# Patient Record
Sex: Male | Born: 1950 | Race: White | Hispanic: No | Marital: Single | Smoking: Never smoker
Health system: Southern US, Community
[De-identification: ages and names within clinical notes are randomized; demographics above are authoritative.]

## PROBLEM LIST (undated history)

## (undated) DIAGNOSIS — I2510 Atherosclerotic heart disease of native coronary artery without angina pectoris: Secondary | ICD-10-CM

## (undated) DIAGNOSIS — I219 Acute myocardial infarction, unspecified: Secondary | ICD-10-CM

## (undated) DIAGNOSIS — I10 Essential (primary) hypertension: Secondary | ICD-10-CM

## (undated) DIAGNOSIS — E785 Hyperlipidemia, unspecified: Secondary | ICD-10-CM

## (undated) HISTORY — PX: PELVIC FRACTURE SURGERY: SHX119

## (undated) HISTORY — DX: Hyperlipidemia, unspecified: E78.5

---

## 2003-07-07 ENCOUNTER — Emergency Department (HOSPITAL_COMMUNITY): Admission: EM | Admit: 2003-07-07 | Discharge: 2003-07-07 | Payer: Self-pay | Admitting: Emergency Medicine
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
## 2007-01-12 ENCOUNTER — Emergency Department (HOSPITAL_COMMUNITY): Admission: EM | Admit: 2007-01-12 | Discharge: 2007-01-12 | Payer: Self-pay | Admitting: Emergency Medicine

## 2008-03-25 IMAGING — CR DG PELVIS 1-2V
3 series · 3 of 3 positions shown · non-contrast
Comparison: none

CLINICAL DATA: Groin and coccyx pain after falling off a horse yesterday.

PELVIS - 3 VIEW:

[view not recorded (1 of 3)]
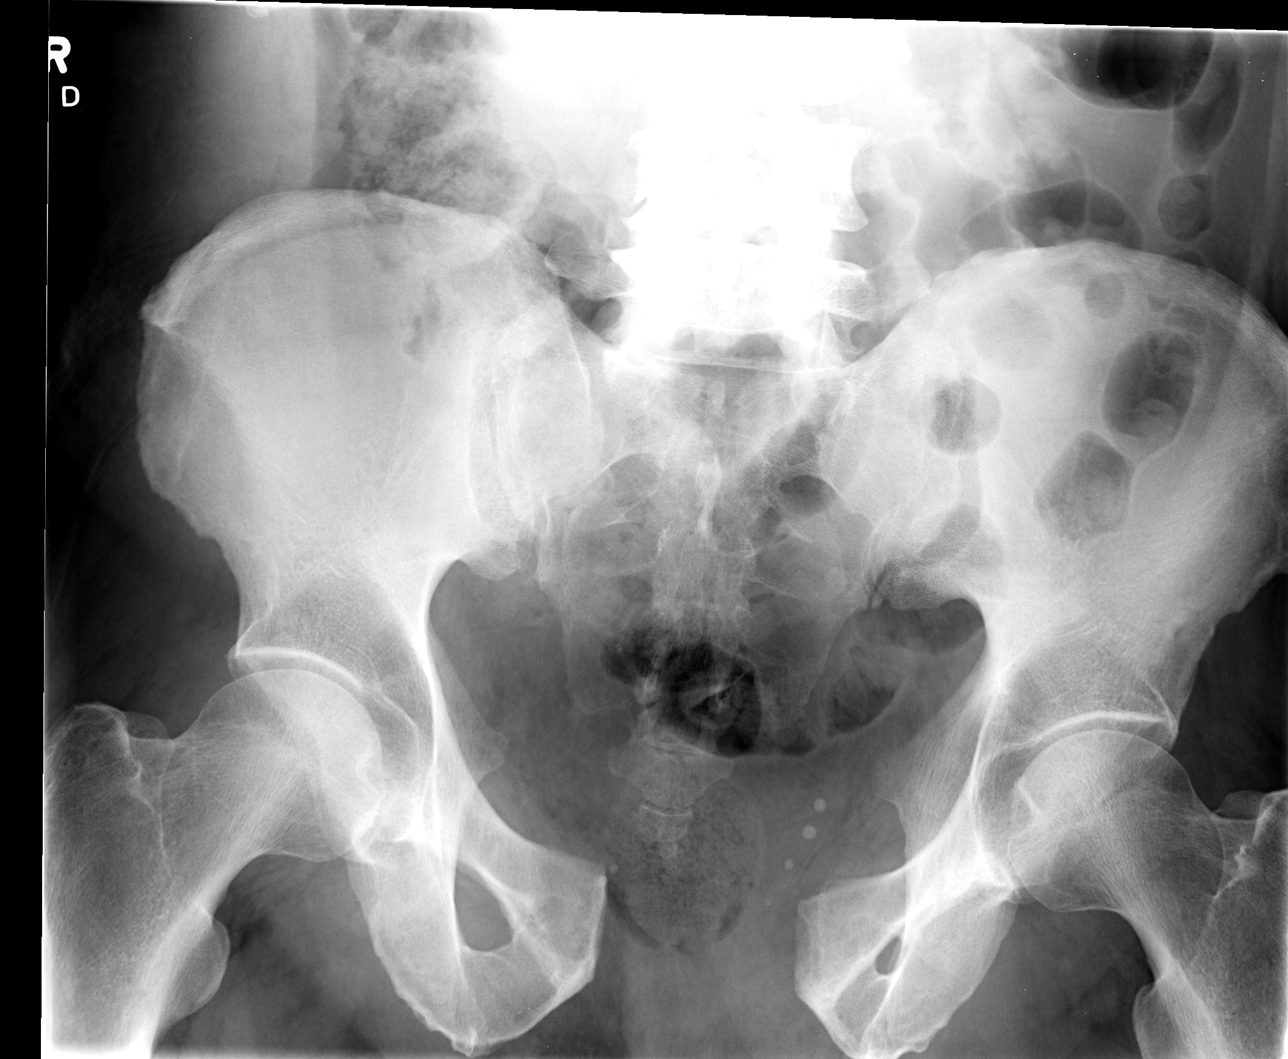

[view not recorded (2 of 3)]
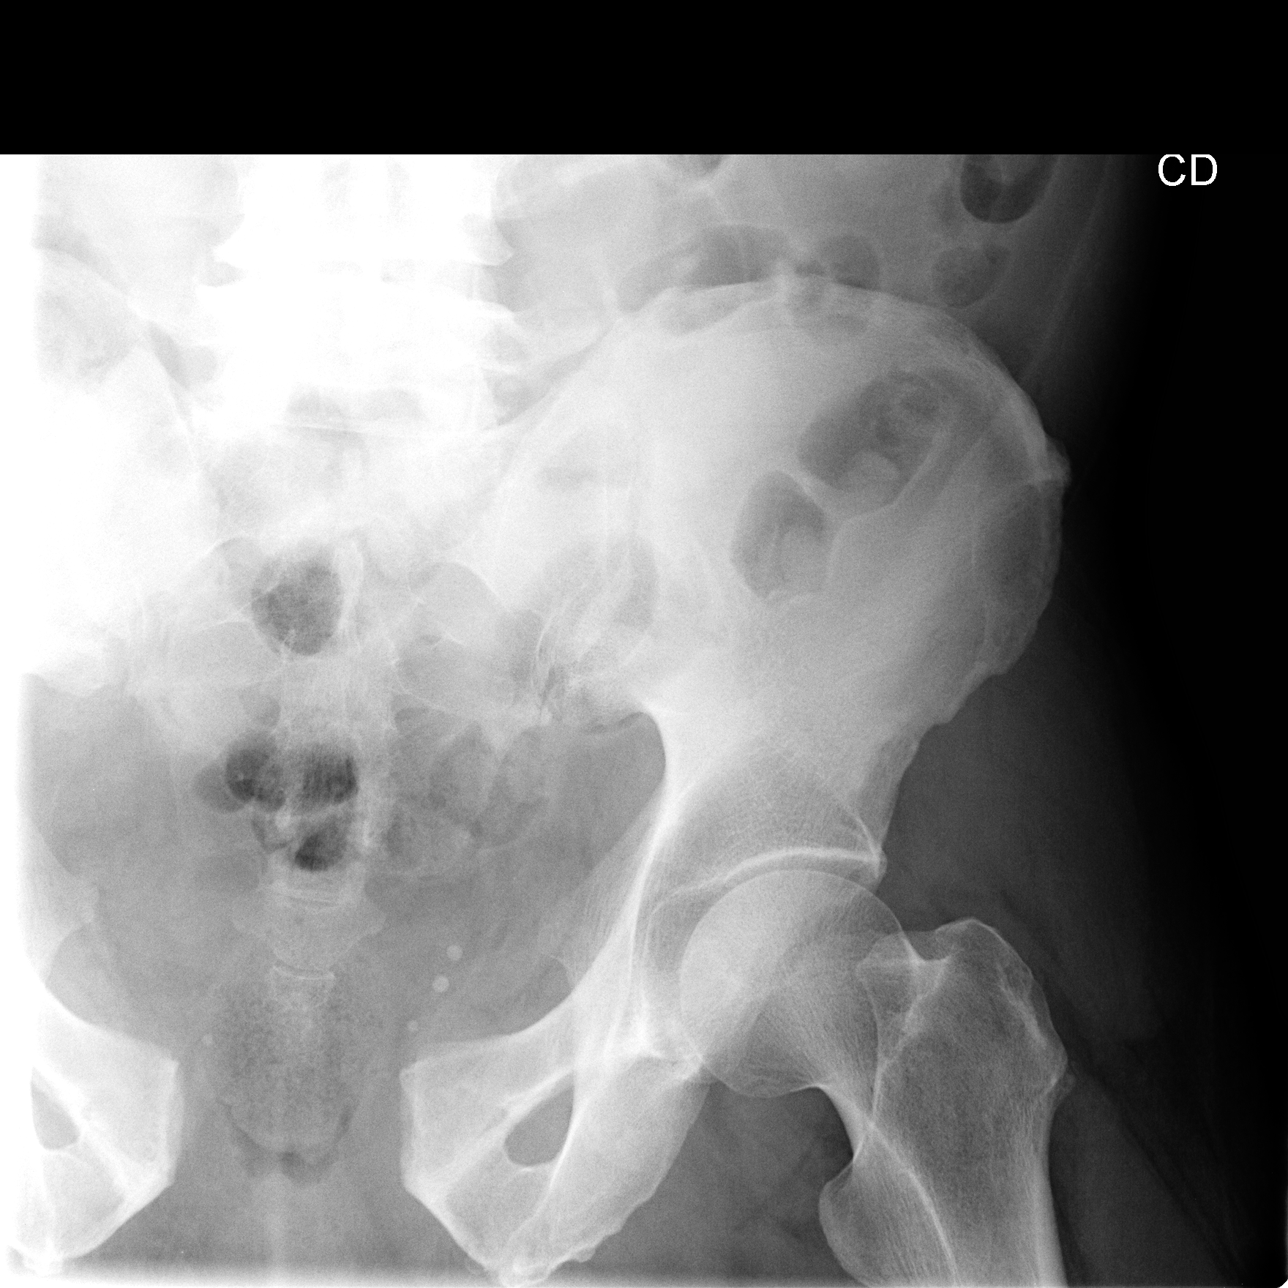

[view not recorded (3 of 3)]
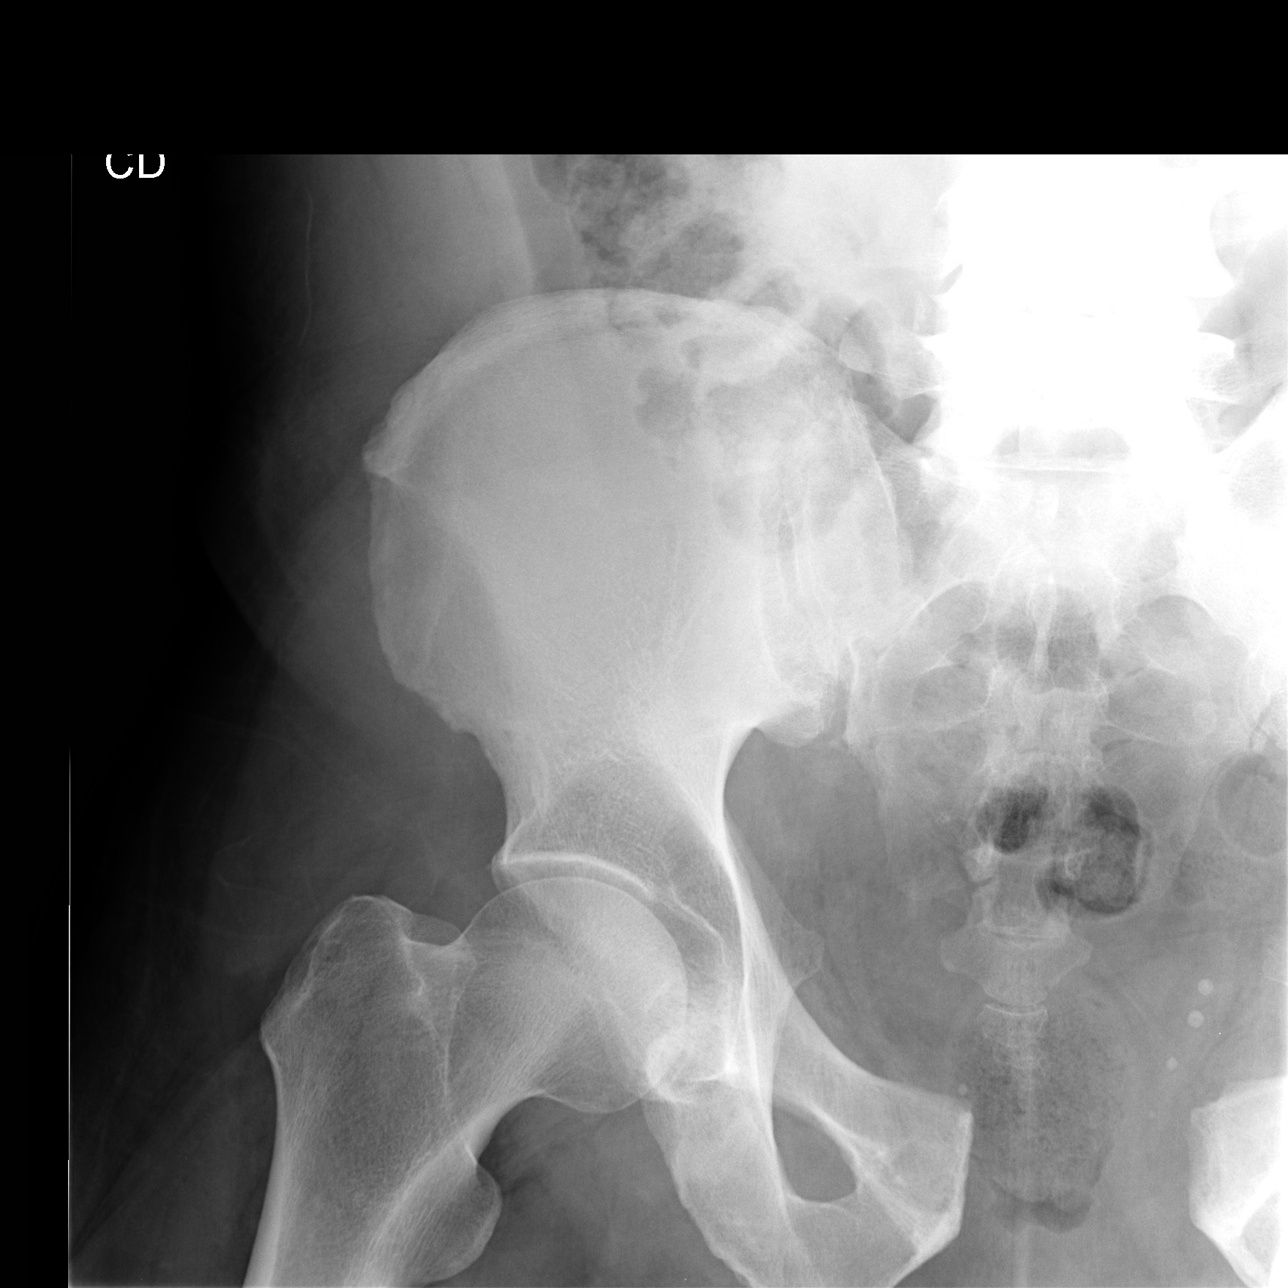

[3 of 3 positions shown; findings below may reference images not displayed]

FINDINGS: Marked diastasis of the symphysis pubis, measuring 6.6 cm and width.
Mild widening of the right sacroiliac joint. No fractures seen. Lower lumbar
spine degenerative changes and mild scoliosis.
IMPRESSION: 1. Marked symphysis pubis diastasis with mild widening of the right sacroiliac
joint.
2. No fracture seen.
3. Lower lumbar spine degenerative changes and mild scoliosis.

## 2019-01-02 DIAGNOSIS — I480 Paroxysmal atrial fibrillation: Secondary | ICD-10-CM

## 2019-01-02 HISTORY — DX: Paroxysmal atrial fibrillation: I48.0

## 2019-01-23 ENCOUNTER — Inpatient Hospital Stay (HOSPITAL_BASED_OUTPATIENT_CLINIC_OR_DEPARTMENT_OTHER)
Admission: EM | Admit: 2019-01-23 | Discharge: 2019-01-28 | DRG: 282 | Disposition: A | Discharge status: Home / Self Care | Payer: Commercial Indemnity | Attending: Cardiology | Admitting: Cardiology

## 2019-01-23 ENCOUNTER — Encounter (HOSPITAL_BASED_OUTPATIENT_CLINIC_OR_DEPARTMENT_OTHER): Payer: Self-pay

## 2019-01-23 ENCOUNTER — Other Ambulatory Visit: Payer: Self-pay

## 2019-01-23 ENCOUNTER — Emergency Department (HOSPITAL_BASED_OUTPATIENT_CLINIC_OR_DEPARTMENT_OTHER): Payer: Commercial Indemnity

## 2019-01-23 DIAGNOSIS — I4891 Unspecified atrial fibrillation: Secondary | ICD-10-CM | POA: Diagnosis not present

## 2019-01-23 DIAGNOSIS — Z20828 Contact with and (suspected) exposure to other viral communicable diseases: Secondary | ICD-10-CM | POA: Diagnosis present

## 2019-01-23 DIAGNOSIS — I214 Non-ST elevation (NSTEMI) myocardial infarction: Secondary | ICD-10-CM | POA: Diagnosis present

## 2019-01-23 DIAGNOSIS — E785 Hyperlipidemia, unspecified: Secondary | ICD-10-CM | POA: Diagnosis present

## 2019-01-23 DIAGNOSIS — Z803 Family history of malignant neoplasm of breast: Secondary | ICD-10-CM | POA: Diagnosis not present

## 2019-01-23 DIAGNOSIS — I2582 Chronic total occlusion of coronary artery: Secondary | ICD-10-CM | POA: Diagnosis present

## 2019-01-23 DIAGNOSIS — Z9119 Patient's noncompliance with other medical treatment and regimen: Secondary | ICD-10-CM

## 2019-01-23 DIAGNOSIS — R079 Chest pain, unspecified: Secondary | ICD-10-CM

## 2019-01-23 DIAGNOSIS — E782 Mixed hyperlipidemia: Secondary | ICD-10-CM

## 2019-01-23 DIAGNOSIS — Z79899 Other long term (current) drug therapy: Secondary | ICD-10-CM | POA: Diagnosis not present

## 2019-01-23 DIAGNOSIS — Z8249 Family history of ischemic heart disease and other diseases of the circulatory system: Secondary | ICD-10-CM

## 2019-01-23 DIAGNOSIS — I10 Essential (primary) hypertension: Secondary | ICD-10-CM | POA: Diagnosis present

## 2019-01-23 DIAGNOSIS — I480 Paroxysmal atrial fibrillation: Secondary | ICD-10-CM

## 2019-01-23 DIAGNOSIS — I200 Unstable angina: Secondary | ICD-10-CM | POA: Diagnosis not present

## 2019-01-23 DIAGNOSIS — I252 Old myocardial infarction: Secondary | ICD-10-CM | POA: Diagnosis not present

## 2019-01-23 DIAGNOSIS — I2510 Atherosclerotic heart disease of native coronary artery without angina pectoris: Secondary | ICD-10-CM | POA: Diagnosis present

## 2019-01-23 HISTORY — DX: Atherosclerotic heart disease of native coronary artery without angina pectoris: I25.10

## 2019-01-23 HISTORY — DX: Acute myocardial infarction, unspecified: I21.9

## 2019-01-23 HISTORY — DX: Essential (primary) hypertension: I10

## 2019-01-23 LAB — BASIC METABOLIC PANEL
Anion gap: 11 (ref 5–15)
BUN: 15 mg/dL (ref 8–23)
CO2: 23 mmol/L (ref 22–32)
Calcium: 9.3 mg/dL (ref 8.9–10.3)
Chloride: 98 mmol/L (ref 98–111)
Creatinine, Ser: 1.01 mg/dL (ref 0.61–1.24)
GFR calc Af Amer: >60 mL/min (ref >60)
GFR calc non Af Amer: >60 mL/min (ref >60)
Glucose, Bld: 129 mg/dL — ABNORMAL HIGH (ref 70–99)
Potassium: 3.8 mmol/L (ref 3.5–5.1)
Sodium: 132 mmol/L — ABNORMAL LOW (ref 135–145)

## 2019-01-23 LAB — TROPONIN I
Troponin I: 7.2 ng/mL (ref <0.03)
Troponin I: 8.1 ng/mL (ref <0.03)

## 2019-01-23 LAB — HEPATIC FUNCTION PANEL
ALT: 35 U/L (ref 0–44)
AST: 80 U/L — ABNORMAL HIGH (ref 15–41)
Albumin: 4.6 g/dL (ref 3.5–5.0)
Alkaline Phosphatase: 84 U/L (ref 38–126)
Bilirubin, Direct: 0.1 mg/dL (ref 0.0–0.2)
Indirect Bilirubin: 1.1 mg/dL — ABNORMAL HIGH (ref 0.3–0.9)
Total Bilirubin: 1.2 mg/dL (ref 0.3–1.2)
Total Protein: 7.9 g/dL (ref 6.5–8.1)

## 2019-01-23 LAB — HEPARIN LEVEL (UNFRACTIONATED): Heparin Unfractionated: 0.13 IU/mL — ABNORMAL LOW (ref 0.30–0.70)

## 2019-01-23 LAB — CBC
HCT: 47.6 % (ref 39.0–52.0)
Hemoglobin: 16.2 g/dL (ref 13.0–17.0)
MCH: 29.5 pg (ref 26.0–34.0)
MCHC: 34 g/dL (ref 30.0–36.0)
MCV: 86.5 fL (ref 80.0–100.0)
Platelets: 180 10*3/uL (ref 150–400)
RBC: 5.5 MIL/uL (ref 4.22–5.81)
RDW: 13.1 % (ref 11.5–15.5)
WBC: 14.8 10*3/uL — ABNORMAL HIGH (ref 4.0–10.5)
nRBC: 0 % (ref 0.0–0.2)

## 2019-01-23 LAB — BRAIN NATRIURETIC PEPTIDE: B Natriuretic Peptide: 226.3 pg/mL — ABNORMAL HIGH (ref 0.0–100.0)

## 2019-01-23 LAB — LIPASE, BLOOD: Lipase: 26 U/L (ref 11–51)

## 2019-01-23 LAB — PROTIME-INR
INR: 1 (ref 0.8–1.2)
Prothrombin Time: 13.5 seconds (ref 11.4–15.2)

## 2019-01-23 LAB — SARS CORONAVIRUS 2 AG (30 MIN TAT): SARS Coronavirus 2 Ag: NEGATIVE

## 2019-01-23 LAB — MAGNESIUM: Magnesium: 1.8 mg/dL (ref 1.7–2.4)

## 2019-01-23 LAB — APTT: aPTT: 26 seconds (ref 24–36)

## 2019-01-23 MED ORDER — METOPROLOL TARTRATE 25 MG PO TABS
25.0000 mg | ORAL_TABLET | Freq: Two times a day (BID) | ORAL | Status: DC
  Administered 2019-01-23 – 2019-01-26 (×6): 25 mg via ORAL
  Filled 2019-01-23 (×6): qty 1

## 2019-01-23 MED ORDER — HEPARIN (PORCINE) 25000 UT/250ML-% IV SOLN
INTRAVENOUS | Status: AC
  Filled 2019-01-23: qty 250

## 2019-01-23 MED ORDER — HEPARIN (PORCINE) 25000 UT/250ML-% IV SOLN
2150.0000 [IU]/h | INTRAVENOUS | Status: DC
  Administered 2019-01-23: 1350 [IU]/h via INTRAVENOUS
  Administered 2019-01-24: 1800 [IU]/h via INTRAVENOUS
  Administered 2019-01-24 – 2019-01-25 (×2): 1900 [IU]/h via INTRAVENOUS
  Administered 2019-01-25 – 2019-01-27 (×5): 2150 [IU]/h via INTRAVENOUS
  Filled 2019-01-23 (×8): qty 250

## 2019-01-23 MED ORDER — DILTIAZEM HCL 100 MG IV SOLR
INTRAVENOUS | Status: AC
  Filled 2019-01-23: qty 100

## 2019-01-23 MED ORDER — ASPIRIN EC 81 MG PO TBEC
81.0000 mg | DELAYED_RELEASE_TABLET | Freq: Every day | ORAL | Status: DC
  Administered 2019-01-24 – 2019-01-27 (×4): 81 mg via ORAL
  Filled 2019-01-23 (×3): qty 1

## 2019-01-23 MED ORDER — NITROGLYCERIN 0.4 MG SL SUBL
0.4000 mg | SUBLINGUAL_TABLET | SUBLINGUAL | Status: DC | PRN
  Filled 2019-01-23: qty 1

## 2019-01-23 MED ORDER — LISINOPRIL 5 MG PO TABS
5.0000 mg | ORAL_TABLET | Freq: Every day | ORAL | Status: DC
  Administered 2019-01-23 – 2019-01-28 (×6): 5 mg via ORAL
  Filled 2019-01-23 (×6): qty 1

## 2019-01-23 MED ORDER — SODIUM CHLORIDE 0.9% FLUSH
3.0000 mL | Freq: Once | INTRAVENOUS | Status: DC
  Filled 2019-01-23: qty 3

## 2019-01-23 MED ORDER — ACETAMINOPHEN 325 MG PO TABS
650.0000 mg | ORAL_TABLET | ORAL | Status: DC | PRN
  Administered 2019-01-23 – 2019-01-25 (×4): 650 mg via ORAL
  Filled 2019-01-23 (×4): qty 2

## 2019-01-23 MED ORDER — ATORVASTATIN CALCIUM 80 MG PO TABS
80.0000 mg | ORAL_TABLET | Freq: Every day | ORAL | Status: DC
  Administered 2019-01-24 – 2019-01-27 (×4): 80 mg via ORAL
  Filled 2019-01-23 (×4): qty 1

## 2019-01-23 MED ORDER — ONDANSETRON HCL 4 MG/2ML IJ SOLN: 4.0000 mg | Freq: Four times a day (QID) | INTRAMUSCULAR | Status: DC | PRN

## 2019-01-23 MED ORDER — HEPARIN BOLUS VIA INFUSION
3000.0000 [IU] | Freq: Once | INTRAVENOUS | Status: AC
  Administered 2019-01-24: 3000 [IU] via INTRAVENOUS
  Filled 2019-01-23: qty 3000

## 2019-01-23 MED ORDER — ASPIRIN 81 MG PO CHEW
324.0000 mg | CHEWABLE_TABLET | Freq: Once | ORAL | Status: AC
  Administered 2019-01-23: 16:00:00 324 mg via ORAL
  Filled 2019-01-23: qty 4

## 2019-01-23 MED ORDER — DILTIAZEM HCL 100 MG IV SOLR
5.0000 mg/h | INTRAVENOUS | Status: DC
  Administered 2019-01-23: 5 mg/h via INTRAVENOUS

## 2019-01-23 MED ORDER — HEPARIN BOLUS VIA INFUSION
4000.0000 [IU] | Freq: Once | INTRAVENOUS | Status: AC
  Administered 2019-01-23: 4000 [IU] via INTRAVENOUS

## 2019-01-23 MED ORDER — NITROGLYCERIN IN D5W 200-5 MCG/ML-% IV SOLN
0.0000 ug/min | INTRAVENOUS | Status: DC
  Administered 2019-01-23: 5 ug/min via INTRAVENOUS
  Filled 2019-01-23: qty 250

## 2019-01-23 MED ORDER — DILTIAZEM HCL 25 MG/5ML IV SOLN
INTRAVENOUS | Status: AC
Start: 2019-01-23 — End: 2019-01-24
  Filled 2019-01-23: qty 5

## 2019-01-23 MED ORDER — SODIUM CHLORIDE 0.9 % IV BOLUS
1000.0000 mL | Freq: Once | INTRAVENOUS | Status: AC
  Administered 2019-01-23: 1000 mL via INTRAVENOUS

## 2019-01-23 NOTE — ED Provider Notes (Signed)
[facility] EMERGENCY DEPARTMENT Provider Note   CSN: [id number] Arrival date & time: 01/23/19  1525    History   Chief Complaint Chief Complaint  Patient presents with  . Chest Pain    HPI Gary Romero is a 68 y.o. male.     The history is provided by the patient and medical records. No language interpreter was used.  Chest Pain  Pain location:  Substernal area Pain quality: crushing and pressure   Pain radiates to:  R arm Pain severity:  Severe Onset quality:  Gradual Duration:  2 days Timing:  Constant Progression:  Waxing and waning Chronicity:  New Relieved by:  Nothing Worsened by:  Exertion Associated symptoms: diaphoresis, nausea and shortness of breath   Associated symptoms: no abdominal pain, no altered mental status, no back pain, no cough, no dizziness, no fatigue, no fever, no headache, no lower extremity edema, no numbness, no palpitations, no syncope and no weakness   Risk factors: coronary artery disease, hypertension and male sex   Risk factors: no prior DVT/PE     Past Medical History:  Diagnosis Date  . Heart attack (HCC)   . Hypertension     There are no active problems to display for this patient.   Past Surgical History:  Procedure Laterality Date  . PELVIC FRACTURE SURGERY          Home Medications    Prior to Admission medications   Not on File    Family History No family history on file.  Social History Social History   Tobacco Use  . Smoking status: Never Smoker  . Smokeless tobacco: Never Used  Substance Use Topics  . Alcohol use: Yes    Comment: occ  . Drug use: Never     Allergies   Patient has no known allergies.   Review of Systems Review of Systems  Constitutional: Positive for diaphoresis. Negative for chills, fatigue and fever.  HENT: Negative for congestion.   Respiratory: Positive for chest tightness and shortness of breath. Negative for cough, wheezing and stridor.    Cardiovascular: Positive for chest pain. Negative for palpitations and syncope.  Gastrointestinal: Positive for nausea. Negative for abdominal pain and diarrhea.  Genitourinary: Negative for dysuria, flank pain and frequency.  Musculoskeletal: Negative for back pain, neck pain and neck stiffness.  Skin: Negative for rash and wound.  Neurological: Negative for dizziness, weakness, light-headedness, numbness and headaches.  Psychiatric/Behavioral: Negative for agitation.  All other systems reviewed and are negative.    Physical Exam Updated Vital Signs BP (!) 153/106   Pulse (!) 129   Temp 98.6 F (37 C)   Resp 18   Ht 6\' 3"  (1.905 m)   Wt 127 kg   SpO2 96%   BMI 35.00 kg/m   Physical Exam Vitals signs and nursing note reviewed.  Constitutional:      General: He is not in acute distress.    Appearance: He is well-developed. He is ill-appearing. He is not toxic-appearing or diaphoretic.  HENT:     Head: Normocephalic and atraumatic.  Eyes:     Extraocular Movements: Extraocular movements intact.     Conjunctiva/sclera: Conjunctivae normal.     Pupils: Pupils are equal, round, and reactive to light.  Neck:     Musculoskeletal: Normal range of motion and neck supple.  Cardiovascular:     Rate and Rhythm: Tachycardia present.     Heart sounds: No murmur.  Pulmonary:     Effort: Pulmonary  effort is normal. No respiratory distress.     Breath sounds: Normal breath sounds. No decreased breath sounds, wheezing, rhonchi or rales.  Chest:     Chest wall: No tenderness.  Abdominal:     Palpations: Abdomen is soft.     Tenderness: There is no abdominal tenderness.  Musculoskeletal:     Right lower leg: He exhibits no tenderness. No edema.     Left lower leg: He exhibits no tenderness. No edema.  Skin:    General: Skin is warm and dry.     Capillary Refill: Capillary refill takes less than 2 seconds.  Neurological:     General: No focal deficit present.     Mental Status: He  is alert.      ED Treatments / Results  Labs (all labs ordered are listed, but only abnormal results are displayed) Labs Reviewed  BASIC METABOLIC PANEL - Abnormal; Notable for the following components:      Result Value   Sodium 132 (*)    Glucose, Bld 129 (*)    All other components within normal limits  CBC - Abnormal; Notable for the following components:   WBC 14.8 (*)    All other components within normal limits  TROPONIN I - Abnormal; Notable for the following components:   Troponin I 7.20 (*)    All other components within normal limits  HEPATIC FUNCTION PANEL - Abnormal; Notable for the following components:   AST 80 (*)    Indirect Bilirubin 1.1 (*)    All other components within normal limits  BRAIN NATRIURETIC PEPTIDE - Abnormal; Notable for the following components:   B Natriuretic Peptide 226.3 (*)    All other components within normal limits  SARS CORONAVIRUS 2 (HOSP ORDER, PERFORMED IN [facility] VIA ABBOTT ID)  LIPASE, BLOOD  MAGNESIUM  PROTIME-INR  APTT    EKG   EKG Interpretation  Date/Time:  Friday Jan 23 2019 15:31:07 EDT Ventricular Rate:  125 PR Interval:    QRS Duration: 103 QT Interval:  324 QTC Calculation: 458 R Axis:   35 Text Interpretation:  Atrial fibrillation Repol abnrm suggests ischemia, diffuse leads No prior ECG for comparison.  Afib with RVR No STEMI Reconfirmed by Theda Belfastegeler, Chris ([id number]) on 01/23/2019 3:40:19 PM  EKG Interpretation  Date/Time:  Friday Jan 23 2019 16:20:29 EDT Ventricular Rate:  142 PR Interval:    QRS Duration: 96 QT Interval:  312 QTC Calculation: 480 R Axis:   32 Text Interpretation:  Atrial fibrillation Nonspecific ST depression, anterior leads Borderline prolonged QT interval When compared to prior, still in afib with RVR.  No STEMI Confirmed by Theda Belfastegeler, Chris ([id number]) on 01/23/2019 4:23:00 PM   Radiology Dg Chest Portable 1 View  Result Date: 01/23/2019 CLINICAL DATA:  Chest pain EXAM: PORTABLE  CHEST 1 VIEW COMPARISON:  None. FINDINGS: Heart size and vascularity normal. Negative for heart failure. Mild atelectasis in the bases due to hypoventilation. Negative for pleural effusion. IMPRESSION: Hypoventilation with mild bibasilar atelectasis. Electronically Signed   By: Marlan Palauharles  Clark M.D.   On: 01/23/2019 16:26    Procedures Procedures (including critical care time)  Dorene SorrowRonnie O Panozzo was evaluated in Emergency Department on 01/24/2019 for the symptoms described in the history of present illness. He was evaluated in the context of the global COVID-19 pandemic, which necessitated consideration that the patient might be at risk for infection with the SARS-CoV-2 virus that causes COVID-19. Institutional protocols and algorithms that pertain to the evaluation of  patients at risk for COVID-19 are in a state of rapid change based on information released by regulatory bodies including the CDC and federal and state organizations. These policies and algorithms were followed during the patient's care in the ED.  CRITICAL CARE Performed by: Canary Brim Merisa Julio Total critical care time: 45 minutes Critical care time was exclusive of separately billable procedures and treating other patients. Critical care was necessary to treat or prevent imminent or life-threatening deterioration. Critical care was time spent personally by me on the following activities: development of treatment plan with patient and/or surrogate as well as nursing, discussions with consultants, evaluation of patient's response to treatment, examination of patient, obtaining history from patient or surrogate, ordering and performing treatments and interventions, ordering and review of laboratory studies, ordering and review of radiographic studies, pulse oximetry and re-evaluation of patient's condition.   Medications Ordered in ED Medications  sodium chloride flush (NS) 0.9 % injection 3 mL (3 mLs Intravenous Not Given 01/23/19 1552)   diltiazem (CARDIZEM) 100 mg in dextrose 5 % 100 mL (1 mg/mL) infusion (5 mg/hr Intravenous New Bag/Given 01/23/19 1610)  diltiazem (CARDIZEM) 100 MG injection (has no administration in time range)  diltiazem (CARDIZEM) 25 MG/5ML injection (has no administration in time range)  nitroGLYCERIN (NITROSTAT) SL tablet 0.4 mg (has no administration in time range)  aspirin chewable tablet 324 mg (324 mg Oral Given 01/23/19 1612)  sodium chloride 0.9 % bolus 1,000 mL (1,000 mLs Intravenous New Bag/Given 01/23/19 1619)     Initial Impression / Assessment and Plan / ED Course  I have reviewed the triage vital signs and the nursing notes.  Pertinent labs & imaging results that were available during my care of the patient were reviewed by me and considered in my medical decision making (see chart for details).        Gary Romero is a 68 y.o. male with a past medical history significant for hypertension and prior CAD with MI who presents with several days of fatigue, chest pain, shortness of breath, and diaphoresis.  Patient reports that her last several days he has been having some generalized fatigue but then yesterday started having chest pain.  He reports it was intermittent and is now persistent.  He reports it is a pressure and tightness in his central chest.  Reports he read down his right arm briefly.  He reports some nausea but no vomiting.  He reports diaphoresis intermittently.  He denies leg pain or leg swelling.  No history of DVT or PE.  He is unsure if this feels like prior heart attack.  He reports that he had a cath at [facility] in the past and they saw blockages but did not want to pursue stenting at that time.  He describes the pain is up to a 7 out of 10 severity he did not take aspirin today.  He denies any recent medication changes.  He denies a history of arrhythmias or A. fib.  He feels lightheaded and fatigued.  He denies any trauma.  On exam, patient's lungs are clear.  No murmur.   Chest is nontender and I cannot reproduce his discomfort.  Abdomen is nontender.  Patient symmetric radial pulses.  Legs are nontender nonedematous.  Patient is tachycardic with rates in the 140s.  EKG shows A. fib with RVR with rates between 130 and 150s.  Intermittently it looks like atrial flutter.  No STEMI seen.  Clinically I am concerned that the patient is having cardiac  type chest pain in the setting of his A. fib with RVR.  Unclear how long patient has had his symptoms as he does not feel palpitations or the tachycardia.  Patient is not on blood thinners.  Patient will be started on diltiazem and will be given aspirin.  He will have work-up to look for other etiologies of his symptoms however patient will likely require admission.  Anticipate admission.  Coronavirus test will be sent as anticipate admission.  4:24 PM While I was watching the telemetry strip, patient is jumping in and out of A. fib with RVR.  Patient has jumped in and out several times.  He remains in A. fib with RVR currently.  Initial troponin returned at 7.20.  Concern now present for an STEMI.  Repeat EKG did not show STEMI.  Patient has mild leukocytosis.  With his ongoing chest pain, will give sublingual nitroglycerin to try alleviate his discomfort.  We will quickly call cardiology.  BNP only slightly elevated at 220.  Kidney function is normal.  4:37 PM Spoke with cardiology who recommended heparinization and admission to their service.  Patient will be admitted to [facility] for further management of likely NSTEMI.   Final Clinical Impressions(s) / ED Diagnoses   Final diagnoses:  NSTEMI (non-ST elevated myocardial infarction) (HCC)  Atrial fibrillation with RVR (HCC)  Chest pain, unspecified type     Clinical Impression: 1. NSTEMI (non-ST elevated myocardial infarction) (HCC)   2. Atrial fibrillation with RVR (HCC)   3. Chest pain, unspecified type     Disposition: Admit  This note was prepared  with assistance of Dragon voice recognition software. Occasional wrong-word or sound-a-like substitutions may have occurred due to the inherent limitations of voice recognition software.      Waldron Gerry, Canary Brim, MD 01/24/19 [phone]

## 2019-01-23 NOTE — Progress Notes (Signed)
ANTICOAGULATION CONSULT NOTE - Initial Consult  Pharmacy Consult for Heparin Indication: chest pain/ACS  No Known Allergies  Patient Measurements: Height: 6\' 3"  (190.5 cm) Weight: 280 lb (127 kg) IBW/kg (Calculated) : 84.5 Heparin Dosing Weight: 112 kg  Vital Signs: Temp: 98.6 F (37 C) (05/22 1547) BP: 164/104 (05/22 1630) Pulse Rate: 77 (05/22 1630)  Labs: Recent Labs    01/23/19 1535  HGB 16.2  HCT 47.6  PLT 180  CREATININE 1.01  TROPONINI 7.20*    Estimated Creatinine Clearance: 103.3 mL/min (by C-G formula based on SCr of 1.01 mg/dL).   Medical History: Past Medical History:  Diagnosis Date  . Heart attack (HCC)   . Hypertension     Medications:  (Not in a hospital admission)   Assessment: ACS  Goal of Therapy:  Heparin level 0.3-0.7 units/ml Monitor platelets by anticoagulation protocol: Yes   Plan:  Give 4000 units bolus x 1 Start heparin infusion at 1350 units/hr  Tanielle Emigh A Dvaughn Fickle 01/23/2019,4:40 PM

## 2019-01-23 NOTE — ED Notes (Signed)
ED Provider at bedside. 

## 2019-01-23 NOTE — Progress Notes (Signed)
ANTICOAGULATION CONSULT NOTE  Pharmacy Consult for Heparin Indication: chest pain/ACS  No Known Allergies  Patient Measurements: Height: 6' (182.9 cm) Weight: 275 lb 11.2 oz (125.1 kg) IBW/kg (Calculated) : 77.6 Heparin Dosing Weight: 112 kg  Vital Signs: Temp: 98.3 F (36.8 C) (05/22 2037) Temp Source: Oral (05/22 2037) BP: 155/95 (05/22 2037) Pulse Rate: 81 (05/22 2119)  Labs: Recent Labs    01/23/19 1535 01/23/19 2027 01/23/19 2258  HGB 16.2  --   --   HCT 47.6  --   --   PLT 180  --   --   APTT 26  --   --   LABPROT 13.5  --   --   INR 1.0  --   --   HEPARINUNFRC  --   --  0.13*  CREATININE 1.01  --   --   TROPONINI 7.20* 8.10*  --     Estimated Creatinine Clearance: 98.3 mL/min (by C-G formula based on SCr of 1.01 mg/dL).   Assessment: 68 y.o. male with chest pain/Afib for heparin   Goal of Therapy:  Heparin level 0.3-0.7 units/ml Monitor platelets by anticoagulation protocol: Yes   Plan:  Heparin 3000 units IV bolus, then increase heparin 1800 units/hr Check heparin level in 8 hours.   Eddie Candle 01/23/2019,11:49 PM

## 2019-01-23 NOTE — ED Triage Notes (Signed)
C/o CP day 2-NAD-steady gait 

## 2019-01-23 NOTE — ED Notes (Signed)
VM left for pt's nephew per his request- Sherwood Stennis [phone]

## 2019-01-23 NOTE — ED Notes (Signed)
Pt denies CP at this time; NTG held

## 2019-01-23 NOTE — ED Notes (Signed)
Placed on O2 @ 2 l/m for HR 140s and SOB, SpO2 95%

## 2019-01-23 NOTE — H&P (Addendum)
Cardiology Admission History and Physical:   Patient ID: Gary Romero MRN: [id number]; DOB: Apr 06, 1951   Admission date: 01/23/2019  Primary Care Provider: Patient, No Pcp Per Primary Cardiologist: No primary care provider on file.  Primary Electrophysiologist:  None   Chief Complaint:  NSTEMI  Patient Profile:   Gary Romero is a 68 y.o. male with known CAD with 3 vessel disease including CTO of LAD in 2009 and hypertension. He presented to [facility] in [place] with c/o chest pain, found to be in afib with RVR. Initial troponin 7.20, so he was [facility] for further evaluation and treatment, plan for cardiac cath.   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 He has a history of MI and multivessel CAD by cath in 2009, treated medically. He says that he moved out of town shortly after 2009 and has not had any cardiac care since then. He has also not seen a PCP and is taking no medications. He only sees dermatology for his hands.   He says that he was doing well with no chest pain or shortness of breath until the last couple of days. He began to feel indigestion like discomfort so he cut back on eating. Yesterday he had more chest pain and today while driving, his chest pain became severe and radiated a little to his right arm. He also had some mild shortness of breath so he went to the [facility] [place] with the following findings.   Troponin 7.20, BNP 226.3, CXR negative for heart failure, COVID test negative, SCr 1.01, K+ 3.8, Mag 1.8.  WBCs up at 14.8.  EKG initially afib with RVR, converted to sinus rhythm with no significant abnormality.  He was started on a heparin drip and transferred to [facility] for further evaluation and treatment including cardiac cath. He says that his chest no longer hurts but he still has a vague, mild discomfort.    Per review of old notes: Cardiac cath in 2009 showed 3  vessel CAD to include mid occlusion of the LAD collateralized from the RCA via septal perforators. There was mild anterior wall hypokinesis, EF 59%. It was decided to trial medical therapy for control of anginal symptoms and secondary prevention with further consideration for attempted PCI of LAD CTO or referral for CABG should the patient remain symptomatic on medical therapy.   He has never smoked and only drinks beer occasionally.  Past Medical History:  Diagnosis Date  . Heart attack (HCC)   . Hypertension    Past Surgical History:  Procedure Laterality Date  . PELVIC FRACTURE SURGERY      Medications Prior to Admission: Prior to Admission medications   Not on File   Allergies:   No Known Allergies  Social History:   Social History   Socioeconomic History  . Marital status: Single    Spouse name: Not on file  . Number of children: Not on file  . Years of education: Not on file  . Highest education level: Not on file  Occupational History  . Not on file  Social Needs  . Financial resource strain: Not on file  . Food insecurity:    Worry: Not on file    Inability: Not on file  . Transportation needs:    Medical: Not on file    Non-medical: Not on file  Tobacco Use  . Smoking status: Never  Smoker  . Smokeless tobacco: Never Used  Substance and Sexual Activity  . Alcohol use: Yes    Comment: occ  . Drug use: Never  . Sexual activity: Not on file  Lifestyle  . Physical activity:    Days per week: Not on file    Minutes per session: Not on file  . Stress: Not on file  Relationships  . Social connections:    Talks on phone: Not on file    Gets together: Not on file    Attends religious service: Not on file    Active member of club or organization: Not on file    Attends meetings of clubs or organizations: Not on file    Relationship status: Not on file  . Intimate partner violence:    Fear of current or ex partner: Not on file    Emotionally abused: Not on  file    Physically abused: Not on file    Forced sexual activity: Not on file  Other Topics Concern  . Not on file  Social History Narrative  . Not on file    Family History:   The patient's family history includes Breast cancer in his mother; CAD in his father.    ROS:  Please see the history of present illness.  All other ROS reviewed and negative.     Physical Exam/Data:   Vitals:   01/23/19 1630 01/23/19 1700 01/23/19 1730 01/23/19 1904  BP: (!) 164/104 (!) 167/100 (!) 169/98 (!) 146/99  Pulse: 77 74 80 71  Resp: (!) 27 (!) 25 (!) 24   Temp:    98.9 F (37.2 C)  TempSrc:    Oral  SpO2: 99% 100% 99% 96%  Weight:    125.1 kg  Height:    6' (1.829 m)    Intake/Output Summary (Last 24 hours) at 01/23/2019 1934 Last data filed at 01/23/2019 1734 Gross per 24 hour  Intake 1003.82 ml  Output -  Net 1003.82 ml   Last 3 Weights 01/23/2019 01/23/2019  Weight (lbs) 275 lb 11.2 oz 280 lb  Weight (kg) 125.057 kg 127.007 kg     Body mass index is 37.39 kg/m.  General:  Well nourished, well developed, in no acute distress, appears withdrawn,  HEENT: normal Lymph: no adenopathy Neck: no JVD Endocrine:  No thryomegaly Vascular: No carotid bruits; FA pulses 2+ bilaterally without bruits  Cardiac:  normal S1, S2; RRR; no murmur  Lungs:  clear to auscultation bilaterally, no wheezing, rhonchi or rales  Abd: soft, nontender, no hepatomegaly  Ext: no edema Musculoskeletal:  No deformities, BUE and BLE strength normal and equal Skin: warm and dry  Neuro:  CNs 2-12 intact, no focal abnormalities noted Psych:  Normal affect   EKG:  The ECG that was done was personally reviewed and demonstrates initially afib with RVR, followed by NSR with no significant abnormalities.  Relevant CV Studies:  Cardiac catheterization 03/16/2008  DIAGNOSTIC SUMMARY  Coronary Artery Disease   Left Main: normal   LAD system: total   LCX system: significant   RCA system: significant    Number of vessels with significant CAD: 3 - Vessel  Left Ventriculogram   Ejection Fraction:  59%  FINAL DIAGNOSIS  786.50 Chest pain unspecified  794.30 Abnormal functional study, unspecified  414.01 Coronary atherosclerosis, of native vessel  414.9 Chronic ischemia heart disease, unspecified  COMMENT  68 y/o male with typical anginal symptoms referred for coronary angiography   following high risk non-invasive risk  stratification.    Coronary angiography demonstrated 3-vessel coronary artery disease to     include the mid occlusion of the LAD which received adequate colateral     filling from the distal RCA via septal perforators. Left     ventriculography demonstrated normal LV systolic function with mild     anterior wall hypokinesis with a normal appearing aortic root.    Findings were discussed with interventional attendings with decision for     a trial of medical therapy for control of anginal symptoms and     secondary prevention with further consideration for attempted PCI of     the LAD CTO or referral for CABG should the patient remain symptomatic     on medical therapy.  Laboratory Data:  Chemistry Recent Labs  Lab 01/23/19 1535  NA 132*  K 3.8  CL 98  CO2 23  GLUCOSE 129*  BUN 15  CREATININE 1.01  CALCIUM 9.3  GFRNONAA >60  GFRAA >60  ANIONGAP 11    Recent Labs  Lab 01/23/19 1553  PROT 7.9  ALBUMIN 4.6  AST 80*  ALT 35  ALKPHOS 84  BILITOT 1.2   Hematology Recent Labs  Lab 01/23/19 1535  WBC 14.8*  RBC 5.50  HGB 16.2  HCT 47.6  MCV 86.5  MCH 29.5  MCHC 34.0  RDW 13.1  PLT 180   Cardiac Enzymes Recent Labs  Lab 01/23/19 1535  TROPONINI 7.20*   No results for input(s): TROPIPOC in the last 168 hours.  BNP Recent Labs  Lab 01/23/19 1554  BNP 226.3*    DDimer No results for input(s): DDIMER in the last 168 hours.  Radiology/Studies:  Dg Chest Portable 1 View  Result Date:  01/23/2019 CLINICAL DATA:  Chest pain EXAM: PORTABLE CHEST 1 VIEW COMPARISON:  None. FINDINGS: Heart size and vascularity normal. Negative for heart failure. Mild atelectasis in the bases due to hypoventilation. Negative for pleural effusion. IMPRESSION: Hypoventilation with mild bibasilar atelectasis. Electronically Signed   By: Marlan Palau M.D.   On: 01/23/2019 16:26    Assessment and Plan:   1. NSTEMI -Pt with known CAD, began to have chest pain 1-2 days ago, became worse today with radiation to rigtht arm and mild shortness of breath.  -Pt found to be in afib with RVR. Once converted to NSR, no acute ischemic changes on EKG. -Troponin 7.20, BNP 226.3, CXR negative for heart failure, COVID test negative -Will start nitro drip.  Continue heparin drip EF -Will get echo  -Start metoprolol 25 p.o. twice daily, lisinopril 5 mg daily, aspirin 81 mg daily, atorvastatin 80 mg daily. -We will obtain lipids.  2. Atrial fibrillation, on Cardizem and heparin drip, I would continue for now, he spontaneously cardioverted to sinus rhythm. 1. CHDS-VASc - 4 2. He will have to be switched to NOAC post cath  3. Hypertension -Currently untreated as he has not seen a medical provider in many years.  -We will start nitroglycerin drip as well as metoprolol and lisinopril and uptitrate as tolerated.  Severity of Illness: The appropriate patient status for this patient is INPATIENT. Inpatient status is judged to be reasonable and necessary in order to provide the required intensity of service to ensure the patient's safety. The patient's presenting symptoms, physical exam findings, and initial radiographic and laboratory data in the context of their chronic comorbidities is felt to place them at high risk for further clinical deterioration. Furthermore, it is not anticipated that the patient will be medically  stable for discharge from the hospital within 2 midnights of admission. The following factors support  the patient status of inpatient.   " The patient's presenting symptoms include chest pain. " The worrisome physical exam findings include atrial fibrillation with RVR. " The chronic co-morbidities include untreated hypertension hyperlipidemia and CAD..  * I certify that at the point of admission it is my clinical judgment that the patient will require inpatient hospital care spanning beyond 2 midnights from the point of admission due to high intensity of service, high risk for further deterioration and high frequency of surveillance required.*  For questions or updates, please contact [facility] Please consult [email or web address] for contact info under   Signed, Berton Bon, NP  01/23/2019 7:34 PM

## 2019-01-23 NOTE — ED Notes (Signed)
Report to Porfirio Oar, Charity fundraiser at [facility]

## 2019-01-23 NOTE — ED Notes (Signed)
Called [facility] - advised bed was ready @ [facility]

## 2019-01-24 ENCOUNTER — Inpatient Hospital Stay (HOSPITAL_COMMUNITY): Payer: Commercial Indemnity

## 2019-01-24 DIAGNOSIS — I214 Non-ST elevation (NSTEMI) myocardial infarction: Secondary | ICD-10-CM

## 2019-01-24 LAB — CBC
HCT: 42.2 % (ref 39.0–52.0)
Hemoglobin: 14.7 g/dL (ref 13.0–17.0)
MCH: 29.9 pg (ref 26.0–34.0)
MCHC: 34.8 g/dL (ref 30.0–36.0)
MCV: 85.8 fL (ref 80.0–100.0)
Platelets: 143 10*3/uL — ABNORMAL LOW (ref 150–400)
RBC: 4.92 MIL/uL (ref 4.22–5.81)
RDW: 13.2 % (ref 11.5–15.5)
WBC: 11.5 10*3/uL — ABNORMAL HIGH (ref 4.0–10.5)
nRBC: 0 % (ref 0.0–0.2)

## 2019-01-24 LAB — BASIC METABOLIC PANEL
Anion gap: 10 (ref 5–15)
BUN: 16 mg/dL (ref 8–23)
CO2: 23 mmol/L (ref 22–32)
Calcium: 8.7 mg/dL — ABNORMAL LOW (ref 8.9–10.3)
Chloride: 99 mmol/L (ref 98–111)
Creatinine, Ser: 1.14 mg/dL (ref 0.61–1.24)
GFR calc Af Amer: >60 mL/min (ref >60)
GFR calc non Af Amer: >60 mL/min (ref >60)
Glucose, Bld: 111 mg/dL — ABNORMAL HIGH (ref 70–99)
Potassium: 3.8 mmol/L (ref 3.5–5.1)
Sodium: 132 mmol/L — ABNORMAL LOW (ref 135–145)

## 2019-01-24 LAB — HIV ANTIBODY (ROUTINE TESTING W REFLEX): HIV Screen 4th Generation wRfx: NONREACTIVE

## 2019-01-24 LAB — LIPID PANEL
Cholesterol: 162 mg/dL (ref 0–200)
HDL: 52 mg/dL (ref >40)
LDL Cholesterol: 86 mg/dL (ref 0–99)
Total CHOL/HDL Ratio: 3.1 RATIO
Triglycerides: 120 mg/dL (ref <150)
VLDL: 24 mg/dL (ref 0–40)

## 2019-01-24 LAB — TROPONIN I
Troponin I: 8.37 ng/mL (ref <0.03)
Troponin I: 7.02 ng/mL (ref <0.03)

## 2019-01-24 LAB — ECHOCARDIOGRAM COMPLETE
Height: 72 in
Weight: 4344 oz

## 2019-01-24 LAB — HEPARIN LEVEL (UNFRACTIONATED): Heparin Unfractionated: 0.31 IU/mL (ref 0.30–0.70)

## 2019-01-24 LAB — HEMOGLOBIN A1C
Hgb A1c MFr Bld: 5.2 % (ref 4.8–5.6)
Mean Plasma Glucose: 102.54 mg/dL

## 2019-01-24 MED ORDER — DILTIAZEM HCL-DEXTROSE 100-5 MG/100ML-% IV SOLN (PREMIX)
5.0000 mg/h | INTRAVENOUS | Status: DC
  Administered 2019-01-24 – 2019-01-25 (×3): 5 mg/h via INTRAVENOUS
  Filled 2019-01-24 (×4): qty 100

## 2019-01-24 MED ORDER — SODIUM CHLORIDE 0.9% FLUSH
3.0000 mL | Freq: Two times a day (BID) | INTRAVENOUS | Status: DC
  Administered 2019-01-24 – 2019-01-28 (×5): 3 mL via INTRAVENOUS

## 2019-01-24 NOTE — Progress Notes (Signed)
Progress Note  Patient Name: Gary Romero Date of Encounter: 01/24/2019  Primary Cardiologist: Delton SeeNelson   Subjective   68 yo admitted with CAD,  Admitted with CP and positive troponin  Also had rapid atrial fib  He is feeling better.  His troponin levels have trended down.  He is not having any further episodes of chest pain after he converted from rapid atrial fibrillation to normal sinus rhythm.  The plan is for heart catheterization.  No further cp   Inpatient Medications    Scheduled Meds: . aspirin EC  81 mg Oral Daily  . atorvastatin  80 mg Oral q1800  . lisinopril  5 mg Oral Daily  . metoprolol tartrate  25 mg Oral BID  . sodium chloride flush  3 mL Intravenous Once   Continuous Infusions: . diltiazem (CARDIZEM) infusion 5 mg/hr (01/24/19 86570608)  . heparin 1,900 Units/hr (01/24/19 1011)  . nitroGLYCERIN 5 mcg/min (01/23/19 2126)   PRN Meds: acetaminophen, nitroGLYCERIN, ondansetron (ZOFRAN) IV   Vital Signs    Vitals:   01/23/19 2359 01/24/19 0531 01/24/19 1017 01/24/19 1217  BP: 127/72 120/75 128/68 108/69  Pulse: 64 69    Resp:  (!) 22    Temp:  98.8 F (37.1 C)  98.5 F (36.9 C)  TempSrc:  Oral  Oral  SpO2: 93% 95%  94%  Weight:  123.2 kg    Height:        Intake/Output Summary (Last 24 hours) at 01/24/2019 1231 Last data filed at 01/24/2019 0641 Gross per 24 hour  Intake 1514.12 ml  Output -  Net 1514.12 ml   Last 3 Weights 01/24/2019 01/23/2019 01/23/2019  Weight (lbs) 271 lb 8 oz 275 lb 11.2 oz 280 lb  Weight (kg) 123.152 kg 125.057 kg 127.007 kg      Telemetry    NSR  - Personally Reviewed  ECG     NSR  - Personally Reviewed  Physical Exam   GEN: No acute distress.   Neck: No JVD Cardiac: RRR, no murmurs, rubs, or gallops.  Respiratory: Clear to auscultation bilaterally. GI: Soft, nontender, non-distended  MS: No edema; No deformity. Neuro:  Nonfocal  Psych: Normal affect   Labs    Chemistry Recent Labs  Lab 01/23/19  1535 01/23/19 1553 01/24/19 0721  NA 132*  --  132*  K 3.8  --  3.8  CL 98  --  99  CO2 23  --  23  GLUCOSE 129*  --  111*  BUN 15  --  16  CREATININE 1.01  --  1.14  CALCIUM 9.3  --  8.7*  PROT  --  7.9  --   ALBUMIN  --  4.6  --   AST  --  80*  --   ALT  --  35  --   ALKPHOS  --  84  --   BILITOT  --  1.2  --   GFRNONAA >60  --  >60  GFRAA >60  --  >60  ANIONGAP 11  --  10     Hematology Recent Labs  Lab 01/23/19 1535 01/24/19 0721  WBC 14.8* 11.5*  RBC 5.50 4.92  HGB 16.2 14.7  HCT 47.6 42.2  MCV 86.5 85.8  MCH 29.5 29.9  MCHC 34.0 34.8  RDW 13.1 13.2  PLT 180 143*    Cardiac Enzymes Recent Labs  Lab 01/23/19 1535 01/23/19 2027 01/24/19 0245 01/24/19 0721  TROPONINI 7.20* 8.10* 8.37* 7.02*  No results for input(s): TROPIPOC in the last 168 hours.   BNP Recent Labs  Lab 01/23/19 1554  BNP 226.3*     DDimer No results for input(s): DDIMER in the last 168 hours.   Radiology    Dg Chest Portable 1 View  Result Date: 01/23/2019 CLINICAL DATA:  Chest pain EXAM: PORTABLE CHEST 1 VIEW COMPARISON:  None. FINDINGS: Heart size and vascularity normal. Negative for heart failure. Mild atelectasis in the bases due to hypoventilation. Negative for pleural effusion. IMPRESSION: Hypoventilation with mild bibasilar atelectasis. Electronically Signed   By: Marlan Palau M.D.   On: 01/23/2019 16:26    Cardiac Studies     Patient Profile     68 y.o. malewith known CAD admitted with rapid AF and positive troponins   Assessment & Plan    1.   Coronary artery disease: Patient presents with chest pressure and a positive troponin level.  This is also in the setting of rapid atrial fibrillation.  He is feeling better now that his atrial fibrillation has resolved.  We will set him up for heart catheterization for Tuesday.  2.  Essential HTN:  BP is better. Continue meds   For questions or updates, please contact [facility] Please consult [email or web address]  for contact info under        Signed, Kristeen Miss, MD  01/24/2019, 12:31 PM

## 2019-01-24 NOTE — Progress Notes (Signed)
  Echocardiogram 2D Echocardiogram has been performed.  Delcie Roch 01/24/2019, 2:33 PM

## 2019-01-24 NOTE — Progress Notes (Signed)
ANTICOAGULATION CONSULT NOTE - Follow Up Consult  Pharmacy Consult for Heparin Indication: chest pain/ACS  No Known Allergies  Patient Measurements: Height: 6' (182.9 cm) Weight: 271 lb 8 oz (123.2 kg) IBW/kg (Calculated) : 77.6 Heparin Dosing Weight: 105.4 kg  Vital Signs: Temp: 98.8 F (37.1 C) (05/23 0531) Temp Source: Oral (05/23 0531) BP: 120/75 (05/23 0531) Pulse Rate: 69 (05/23 0531)  Labs: Recent Labs    01/23/19 1535 01/23/19 2027 01/23/19 2258 01/24/19 0245 01/24/19 0721  HGB 16.2  --   --   --  14.7  HCT 47.6  --   --   --  42.2  PLT 180  --   --   --  143*  APTT 26  --   --   --   --   LABPROT 13.5  --   --   --   --   INR 1.0  --   --   --   --   HEPARINUNFRC  --   --  0.13*  --  0.31  CREATININE 1.01  --   --   --  1.14  TROPONINI 7.20* 8.10*  --  8.37* 7.02*    Estimated Creatinine Clearance: 86.4 mL/min (by C-G formula based on SCr of 1.14 mg/dL).   Assessment: Anticoag: chest pain/ACS: IV heparin. HL 0.31 in goal. Hgb 14.7 ok, Plts 180>143 this AM. CHADS-VASc - 4  Goal of Therapy:  Heparin level 0.3-0.7 units/ml Monitor platelets by anticoagulation protocol: Yes   Plan:  IV heparin empirically increase to 1900 units/hr Daily HL, CBC  Plan for Echo and cardiac cath F/u med rec   Derry Arbogast S. Merilynn Finland, PharmD, BCPS Clinical Staff Pharmacist Misty Stanley Stillinger 01/24/2019,9:29 AM

## 2019-01-25 LAB — BASIC METABOLIC PANEL
Anion gap: 10 (ref 5–15)
BUN: 16 mg/dL (ref 8–23)
CO2: 25 mmol/L (ref 22–32)
Calcium: 8.6 mg/dL — ABNORMAL LOW (ref 8.9–10.3)
Chloride: 99 mmol/L (ref 98–111)
Creatinine, Ser: 1.17 mg/dL (ref 0.61–1.24)
GFR calc Af Amer: >60 mL/min (ref >60)
GFR calc non Af Amer: >60 mL/min (ref >60)
Glucose, Bld: 110 mg/dL — ABNORMAL HIGH (ref 70–99)
Potassium: 3.9 mmol/L (ref 3.5–5.1)
Sodium: 134 mmol/L — ABNORMAL LOW (ref 135–145)

## 2019-01-25 LAB — HEPARIN LEVEL (UNFRACTIONATED): Heparin Unfractionated: 0.28 IU/mL — ABNORMAL LOW (ref 0.30–0.70)

## 2019-01-25 LAB — CBC
HCT: 42.5 % (ref 39.0–52.0)
Hemoglobin: 14.5 g/dL (ref 13.0–17.0)
MCH: 29.7 pg (ref 26.0–34.0)
MCHC: 34.1 g/dL (ref 30.0–36.0)
MCV: 86.9 fL (ref 80.0–100.0)
Platelets: 137 10*3/uL — ABNORMAL LOW (ref 150–400)
RBC: 4.89 MIL/uL (ref 4.22–5.81)
RDW: 13.2 % (ref 11.5–15.5)
WBC: 10.1 10*3/uL (ref 4.0–10.5)
nRBC: 0 % (ref 0.0–0.2)

## 2019-01-25 MED ORDER — SODIUM CHLORIDE 0.9 % IV SOLN: 250.0000 mL | INTRAVENOUS | Status: DC | PRN

## 2019-01-25 MED ORDER — SODIUM CHLORIDE 0.9 % IV SOLN
INTRAVENOUS | Status: DC
  Administered 2019-01-27: via INTRAVENOUS
  Administered 2019-01-27: 122 mL/h via INTRAVENOUS

## 2019-01-25 MED ORDER — ASPIRIN 81 MG PO CHEW
81.0000 mg | CHEWABLE_TABLET | ORAL | Status: AC
  Filled 2019-01-25 (×2): qty 1

## 2019-01-25 MED ORDER — SODIUM CHLORIDE 0.9% FLUSH: 3.0000 mL | INTRAVENOUS | Status: DC | PRN

## 2019-01-25 NOTE — Progress Notes (Signed)
ANTICOAGULATION CONSULT NOTE - Follow Up Consult  Pharmacy Consult for Heparin Indication: chest pain/ACS  No Known Allergies  Patient Measurements: Height: 6' (182.9 cm) Weight: 269 lb 11.2 oz (122.3 kg) IBW/kg (Calculated) : 77.6 Heparin Dosing Weight: 105.4 kg  Vital Signs: Temp: 98.4 F (36.9 C) (05/24 0611) Temp Source: Oral (05/24 0611) BP: 137/74 (05/24 0611) Pulse Rate: 67 (05/24 0611)  Labs: Recent Labs    01/23/19 1535 01/23/19 2027 01/23/19 2258 01/24/19 0245 01/24/19 0721 01/25/19 0553  HGB 16.2  --   --   --  14.7 14.5  HCT 47.6  --   --   --  42.2 42.5  PLT 180  --   --   --  143* 137*  APTT 26  --   --   --   --   --   LABPROT 13.5  --   --   --   --   --   INR 1.0  --   --   --   --   --   HEPARINUNFRC  --   --  0.13*  --  0.31 0.28*  CREATININE 1.01  --   --   --  1.14 1.17  TROPONINI 7.20* 8.10*  --  8.37* 7.02*  --     Estimated Creatinine Clearance: 83.9 mL/min (by C-G formula based on SCr of 1.17 mg/dL).   Assessment: Anticoag: chest pain/ACS: IV heparin. HL 0.28 slightly low.. Hgb 14.5 ok, Plts 180>143>137 this AM. CHADS-VASc - 4. Afib resolved  Goal of Therapy:  Heparin level 0.3-0.7 units/ml Monitor platelets by anticoagulation protocol: Yes   Plan:  IV heparin increase to 2150  units/hr Daily HL, CBC  Plan for cardiac cath Tues   Gursimran Litaker S. Merilynn Finland, PharmD, BCPS Clinical Staff Pharmacist Misty Stanley Stillinger 01/25/2019,7:55 AM

## 2019-01-25 NOTE — Progress Notes (Signed)
Progress Note  Patient Name: Gary Romero Date of Encounter: 01/25/2019  Primary Cardiologist: Delton See   Subjective   68 yo admitted with CAD,  Admitted with CP and positive troponin  Also had rapid atrial fib  He is feeling better.  His troponin levels have trended down.  He is not having any further episodes of chest pain after he converted from rapid atrial fibrillation to normal sinus rhythm.  The plan is for heart catheterization.  No further CP   Inpatient Medications    Scheduled Meds: . aspirin EC  81 mg Oral Daily  . atorvastatin  80 mg Oral q1800  . lisinopril  5 mg Oral Daily  . metoprolol tartrate  25 mg Oral BID  . sodium chloride flush  3 mL Intravenous Once  . sodium chloride flush  3 mL Intravenous Q12H   Continuous Infusions: . diltiazem (CARDIZEM) infusion 5 mg/hr (01/25/19 0106)  . heparin 2,150 Units/hr (01/25/19 0827)  . nitroGLYCERIN 5 mcg/min (01/23/19 2126)   PRN Meds: acetaminophen, nitroGLYCERIN, ondansetron (ZOFRAN) IV   Vital Signs    Vitals:   01/25/19 0610 01/25/19 0611 01/25/19 0859 01/25/19 1115  BP:  137/74 128/77 115/74  Pulse:  67 69 72  Resp:   20   Temp:  98.4 F (36.9 C) 99.3 F (37.4 C) 98.6 F (37 C)  TempSrc:  Oral Oral Oral  SpO2:  95% 97% 95%  Weight: 122.3 kg     Height:        Intake/Output Summary (Last 24 hours) at 01/25/2019 1152 Last data filed at 01/25/2019 0827 Gross per 24 hour  Intake 627.28 ml  Output 1350 ml  Net -722.72 ml   Last 3 Weights 01/25/2019 01/24/2019 01/23/2019  Weight (lbs) 269 lb 11.2 oz 271 lb 8 oz 275 lb 11.2 oz  Weight (kg) 122.335 kg 123.152 kg 125.057 kg      Telemetry    NSR - Personally Reviewed  ECG     NSR  - Personally Reviewed  Physical Exam   Physical Exam: Blood pressure 115/74, pulse 72, temperature 98.6 F (37 C), temperature source Oral, resp. rate 20, height 6' (1.829 m), weight 122.3 kg, SpO2 95 %.  GEN:   Middle age, obese man,  NAD  HEENT: Normal  NECK: No JVD; No carotid bruits LYMPHATICS: No lymphadenopathy CARDIAC: RRR  RESPIRATORY:  Clear to auscultation without rales, wheezing or rhonchi  ABDOMEN: Soft, non-tender, non-distended MUSCULOSKELETAL:  No edema; No deformity,  Right radial pulse is 2 +   SKIN: Warm and dry NEUROLOGIC:  Alert and oriented x 3   Labs    Chemistry Recent Labs  Lab 01/23/19 1535 01/23/19 1553 01/24/19 0721 01/25/19 0553  NA 132*  --  132* 134*  K 3.8  --  3.8 3.9  CL 98  --  99 99  CO2 23  --  23 25  GLUCOSE 129*  --  111* 110*  BUN 15  --  16 16  CREATININE 1.01  --  1.14 1.17  CALCIUM 9.3  --  8.7* 8.6*  PROT  --  7.9  --   --   ALBUMIN  --  4.6  --   --   AST  --  80*  --   --   ALT  --  35  --   --   ALKPHOS  --  84  --   --   BILITOT  --  1.2  --   --  GFRNONAA >60  --  >60 >60  GFRAA >60  --  >60 >60  ANIONGAP 11  --  10 10     Hematology Recent Labs  Lab 01/23/19 1535 01/24/19 0721 01/25/19 0553  WBC 14.8* 11.5* 10.1  RBC 5.50 4.92 4.89  HGB 16.2 14.7 14.5  HCT 47.6 42.2 42.5  MCV 86.5 85.8 86.9  MCH 29.5 29.9 29.7  MCHC 34.0 34.8 34.1  RDW 13.1 13.2 13.2  PLT 180 143* 137*    Cardiac Enzymes Recent Labs  Lab 01/23/19 1535 01/23/19 2027 01/24/19 0245 01/24/19 0721  TROPONINI 7.20* 8.10* 8.37* 7.02*   No results for input(s): TROPIPOC in the last 168 hours.   BNP Recent Labs  Lab 01/23/19 1554  BNP 226.3*     DDimer No results for input(s): DDIMER in the last 168 hours.   Radiology    Dg Chest Portable 1 View  Result Date: 01/23/2019 CLINICAL DATA:  Chest pain EXAM: PORTABLE CHEST 1 VIEW COMPARISON:  None. FINDINGS: Heart size and vascularity normal. Negative for heart failure. Mild atelectasis in the bases due to hypoventilation. Negative for pleural effusion. IMPRESSION: Hypoventilation with mild bibasilar atelectasis. Electronically Signed   By: Marlan Palauharles  Clark M.D.   On: 01/23/2019 16:26    Cardiac Studies     Patient Profile     68  y.o. malewith known CAD admitted with rapid AF and positive troponins   Assessment & Plan    1.   Coronary artery disease: Patient presents with chest pressure and a positive troponin level.  This is also in the setting of rapid atrial fibrillation.  He is feeling better now that his atrial fibrillation has resolved.  Slight dull ache in his chest but not severe cp. We discussed risks, benefits, options,  He understands and agrees to proceed.   2.  Essential HTN:    BP is well controlled.     For questions or updates, please contact [facility] Please consult [email or web address] for contact info under        Signed, Kristeen MissPhilip Teriah Muela, MD  01/25/2019, 11:52 AM

## 2019-01-26 DIAGNOSIS — R079 Chest pain, unspecified: Secondary | ICD-10-CM

## 2019-01-26 DIAGNOSIS — E785 Hyperlipidemia, unspecified: Secondary | ICD-10-CM

## 2019-01-26 DIAGNOSIS — I200 Unstable angina: Secondary | ICD-10-CM

## 2019-01-26 DIAGNOSIS — I4891 Unspecified atrial fibrillation: Secondary | ICD-10-CM

## 2019-01-26 DIAGNOSIS — I10 Essential (primary) hypertension: Secondary | ICD-10-CM

## 2019-01-26 LAB — BASIC METABOLIC PANEL WITH GFR
Anion gap: 6 (ref 5–15)
BUN: 14 mg/dL (ref 8–23)
CO2: 26 mmol/L (ref 22–32)
Calcium: 8.5 mg/dL — ABNORMAL LOW (ref 8.9–10.3)
Chloride: 102 mmol/L (ref 98–111)
Creatinine, Ser: 1.11 mg/dL (ref 0.61–1.24)
GFR calc Af Amer: >60 mL/min (ref >60)
GFR calc non Af Amer: >60 mL/min (ref >60)
Glucose, Bld: 106 mg/dL — ABNORMAL HIGH (ref 70–99)
Potassium: 4.2 mmol/L (ref 3.5–5.1)
Sodium: 134 mmol/L — ABNORMAL LOW (ref 135–145)

## 2019-01-26 LAB — CBC
HCT: 41.5 % (ref 39.0–52.0)
Hemoglobin: 14.5 g/dL (ref 13.0–17.0)
MCH: 30.1 pg (ref 26.0–34.0)
MCHC: 34.9 g/dL (ref 30.0–36.0)
MCV: 86.3 fL (ref 80.0–100.0)
Platelets: 145 K/uL — ABNORMAL LOW (ref 150–400)
RBC: 4.81 MIL/uL (ref 4.22–5.81)
RDW: 13.1 % (ref 11.5–15.5)
WBC: 8.1 K/uL (ref 4.0–10.5)
nRBC: 0 % (ref 0.0–0.2)

## 2019-01-26 LAB — HEPARIN LEVEL (UNFRACTIONATED): Heparin Unfractionated: 0.32 [IU]/mL (ref 0.30–0.70)

## 2019-01-26 MED ORDER — ASPIRIN 81 MG PO CHEW
81.0000 mg | CHEWABLE_TABLET | ORAL | Status: AC
  Administered 2019-01-27: 81 mg via ORAL

## 2019-01-26 MED ORDER — ISOSORBIDE MONONITRATE ER 30 MG PO TB24
30.0000 mg | ORAL_TABLET | Freq: Every day | ORAL | Status: DC
  Administered 2019-01-26 – 2019-01-28 (×3): 30 mg via ORAL
  Filled 2019-01-26 (×3): qty 1

## 2019-01-26 MED ORDER — SODIUM CHLORIDE 0.9 % WEIGHT BASED INFUSION
3.0000 mL/kg/h | INTRAVENOUS | Status: DC
  Administered 2019-01-27: 3 mL/kg/h via INTRAVENOUS

## 2019-01-26 MED ORDER — METOPROLOL TARTRATE 50 MG PO TABS
50.0000 mg | ORAL_TABLET | Freq: Two times a day (BID) | ORAL | Status: DC
  Administered 2019-01-26 – 2019-01-28 (×4): 50 mg via ORAL
  Filled 2019-01-26 (×4): qty 1

## 2019-01-26 MED ORDER — SODIUM CHLORIDE 0.9% FLUSH: 3.0000 mL | INTRAVENOUS | Status: DC | PRN

## 2019-01-26 MED ORDER — SODIUM CHLORIDE 0.9% FLUSH
3.0000 mL | Freq: Two times a day (BID) | INTRAVENOUS | Status: DC
  Administered 2019-01-27: 3 mL via INTRAVENOUS

## 2019-01-26 MED ORDER — SODIUM CHLORIDE 0.9 % WEIGHT BASED INFUSION
1.0000 mL/kg/h | INTRAVENOUS | Status: DC
  Administered 2019-01-27 (×2): 1 mL/kg/h via INTRAVENOUS

## 2019-01-26 MED ORDER — SODIUM CHLORIDE 0.9 % IV SOLN: 250.0000 mL | INTRAVENOUS | Status: DC | PRN

## 2019-01-26 NOTE — Progress Notes (Signed)
Progress Note  Patient Name: Gary Romero Date of Encounter: 01/26/2019  Primary Cardiologist: Delton See   Subjective   The patient continues to have chest heaviness.  Inpatient Medications    Scheduled Meds: . aspirin  81 mg Oral Pre-Cath  . aspirin EC  81 mg Oral Daily  . atorvastatin  80 mg Oral q1800  . lisinopril  5 mg Oral Daily  . metoprolol tartrate  25 mg Oral BID  . sodium chloride flush  3 mL Intravenous Once  . sodium chloride flush  3 mL Intravenous Q12H   Continuous Infusions: . sodium chloride    . sodium chloride    . diltiazem (CARDIZEM) infusion 5 mg/hr (01/25/19 2243)  . heparin 2,150 Units/hr (01/26/19 0257)  . nitroGLYCERIN 5 mcg/min (01/25/19 2244)   PRN Meds: sodium chloride, acetaminophen, nitroGLYCERIN, ondansetron (ZOFRAN) IV, sodium chloride flush   Vital Signs    Vitals:   01/25/19 2138 01/26/19 0006 01/26/19 0427 01/26/19 0817  BP:  124/75 (!) 143/89 136/79  Pulse: 71 67 68   Resp:      Temp:  98.2 F (36.8 C) 98.4 F (36.9 C) 98.2 F (36.8 C)  TempSrc:  Oral Oral Oral  SpO2:  95% 97% 96%  Weight:   122.8 kg   Height:        Intake/Output Summary (Last 24 hours) at 01/26/2019 1216 Last data filed at 01/26/2019 0818 Gross per 24 hour  Intake 1349.5 ml  Output 2075 ml  Net -725.5 ml   Last 3 Weights 01/26/2019 01/25/2019 01/24/2019  Weight (lbs) 270 lb 11.2 oz 269 lb 11.2 oz 271 lb 8 oz  Weight (kg) 122.789 kg 122.335 kg 123.152 kg     Telemetry    NSR - Personally Reviewed  ECG     NSR  - Personally Reviewed  Physical Exam   Physical Exam: Blood pressure 136/79, pulse 68, temperature 98.2 F (36.8 C), temperature source Oral, resp. rate 20, height 6' (1.829 m), weight 122.8 kg, SpO2 96 %.  GEN:   Middle age, obese man,  NAD  HEENT: Normal NECK: No JVD; No carotid bruits LYMPHATICS: No lymphadenopathy CARDIAC: RRR  RESPIRATORY:  Clear to auscultation without rales, wheezing or rhonchi  ABDOMEN: Soft, non-tender,  non-distended MUSCULOSKELETAL:  No edema; No deformity,  Right radial pulse is 2 +   SKIN: Warm and dry NEUROLOGIC:  Alert and oriented x 3  Labs    Chemistry Recent Labs  Lab 01/23/19 1553 01/24/19 0721 01/25/19 0553 01/26/19 0432  NA  --  132* 134* 134*  K  --  3.8 3.9 4.2  CL  --  99 99 102  CO2  --  23 25 26   GLUCOSE  --  111* 110* 106*  BUN  --  16 16 14   CREATININE  --  1.14 1.17 1.11  CALCIUM  --  8.7* 8.6* 8.5*  PROT 7.9  --   --   --   ALBUMIN 4.6  --   --   --   AST 80*  --   --   --   ALT 35  --   --   --   ALKPHOS 84  --   --   --   BILITOT 1.2  --   --   --   GFRNONAA  --  >60 >60 >60  GFRAA  --  >60 >60 >60  ANIONGAP  --  10 10 6      Hematology Recent Labs  Lab 01/24/19 0721 01/25/19 0553 01/26/19 0432  WBC 11.5* 10.1 8.1  RBC 4.92 4.89 4.81  HGB 14.7 14.5 14.5  HCT 42.2 42.5 41.5  MCV 85.8 86.9 86.3  MCH 29.9 29.7 30.1  MCHC 34.8 34.1 34.9  RDW 13.2 13.2 13.1  PLT 143* 137* 145*    Cardiac Enzymes Recent Labs  Lab 01/23/19 1535 01/23/19 2027 01/24/19 0245 01/24/19 0721  TROPONINI 7.20* 8.10* 8.37* 7.02*   No results for input(s): TROPIPOC in the last 168 hours.   BNP Recent Labs  Lab 01/23/19 1554  BNP 226.3*     DDimer No results for input(s): DDIMER in the last 168 hours.   Radiology    No results found.  Cardiac Studies   TTE: 01/24/2019  The left ventricle has normal systolic function, with an ejection fraction of 55-60%. The cavity size was normal. Left ventricular diastolic Doppler parameters are consistent with pseudonormalization.  2. The right ventricle has normal systolic function. The cavity was normal. There is no increase in right ventricular wall thickness.  3. Trivial pericardial effusion is present.  4. The interatrial septum was not assessed.  Patient Profile     68 y.o. malewith known CAD admitted with rapid AF and positive troponins   Assessment & Plan    1.   Coronary artery disease: known CAD,  no medical care since his cath in 2009, on no meds since then.  Patient presents with chest pressure and a positive troponin level.  This is also in the setting of rapid atrial fibrillation. His troponin is downtrending 7.2->8.1->8.37->7.02. LVEF 55-60%, no regional wall motion abnormalities. I will continue heparin drip till cath tomorrow. - switch iv NTG to po Indur 30 mg daily - continue ASA 81 mg po daily and atorvastatin 80 mg po daily  2. Essential HTN:    Borderline, increase lopressor to 50 mg po daily, add imdur 30 mg po daily.  3. Paroxysmal atrial fibrillation - d/c iv cardizem, increase metoprolol to 50 mg po BID, continue iv heparin, will need transition to NOAC post cath tomorrow  4. Hyperlipidemia - started on atorvastatin 80 mg po daily  For questions or updates, please contact [facility] Please consult [email or web address] for contact info under     Signed, Tobias AlexanderKatarina Moksh Loomer, MD  01/26/2019, 12:16 PM

## 2019-01-26 NOTE — Progress Notes (Signed)
ANTICOAGULATION CONSULT NOTE - Follow Up Consult  Pharmacy Consult for Heparin Indication: chest pain/ACS  No Known Allergies  Patient Measurements: Height: 6' (182.9 cm) Weight: 270 lb 11.2 oz (122.8 kg) IBW/kg (Calculated) : 77.6 Heparin Dosing Weight: 105.4 kg  Vital Signs: Temp: 98.2 F (36.8 C) (05/25 0817) Temp Source: Oral (05/25 0817) BP: 136/79 (05/25 0817) Pulse Rate: 68 (05/25 0427)  Labs: Recent Labs    01/23/19 1535 01/23/19 2027  01/24/19 0245 01/24/19 0721 01/25/19 0553 01/26/19 0432  HGB 16.2  --   --   --  14.7 14.5 14.5  HCT 47.6  --   --   --  42.2 42.5 41.5  PLT 180  --   --   --  143* 137* 145*  APTT 26  --   --   --   --   --   --   LABPROT 13.5  --   --   --   --   --   --   INR 1.0  --   --   --   --   --   --   HEPARINUNFRC  --   --    < >  --  0.31 0.28* 0.32  CREATININE 1.01  --   --   --  1.14 1.17 1.11  TROPONINI 7.20* 8.10*  --  8.37* 7.02*  --   --    < > = values in this interval not displayed.    Estimated Creatinine Clearance: 88.6 mL/min (by C-G formula based on SCr of 1.11 mg/dL).   Assessment: Gary Romero is a 68yo male admitted with chest pain/ACS in the setting of afib RVR. Pharmacy consulted to start IV heparin.   5/25 Update: Heparin infusion currently at 2150 units/hr, heparin level currently therapeutic at 0.32. No infusion issues noted. CBC stable with Hgb 14.5 and pltc 145   Goal of Therapy:  Heparin level 0.3-0.7 units/ml Monitor platelets by anticoagulation protocol: Yes   Plan:  Continue heparin at 2150 units/hr Daily HL, CBC   Plan for cardiac cath Tues  Thank you for involving pharmacy in this patient's care.  Wendelyn Breslow, PharmD PGY1 Pharmacy Resident Phone: [phone] 01/26/2019 8:35 AM

## 2019-01-27 ENCOUNTER — Encounter (HOSPITAL_COMMUNITY)
Admission: EM | Disposition: A | Discharge status: Home / Self Care | Payer: Self-pay | Source: Home / Self Care | Attending: Cardiology

## 2019-01-27 DIAGNOSIS — I2510 Atherosclerotic heart disease of native coronary artery without angina pectoris: Secondary | ICD-10-CM

## 2019-01-27 HISTORY — PX: LEFT HEART CATH AND CORONARY ANGIOGRAPHY: CATH118249

## 2019-01-27 LAB — BASIC METABOLIC PANEL
Anion gap: 9 (ref 5–15)
BUN: 12 mg/dL (ref 8–23)
CO2: 24 mmol/L (ref 22–32)
Calcium: 8.6 mg/dL — ABNORMAL LOW (ref 8.9–10.3)
Chloride: 103 mmol/L (ref 98–111)
Creatinine, Ser: 1.07 mg/dL (ref 0.61–1.24)
GFR calc Af Amer: >60 mL/min (ref >60)
GFR calc non Af Amer: >60 mL/min (ref >60)
Glucose, Bld: 101 mg/dL — ABNORMAL HIGH (ref 70–99)
Potassium: 4.4 mmol/L (ref 3.5–5.1)
Sodium: 136 mmol/L (ref 135–145)

## 2019-01-27 LAB — CBC
HCT: 41.2 % (ref 39.0–52.0)
Hemoglobin: 14 g/dL (ref 13.0–17.0)
MCH: 29.6 pg (ref 26.0–34.0)
MCHC: 34 g/dL (ref 30.0–36.0)
MCV: 87.1 fL (ref 80.0–100.0)
Platelets: 162 10*3/uL (ref 150–400)
RBC: 4.73 MIL/uL (ref 4.22–5.81)
RDW: 13 % (ref 11.5–15.5)
WBC: 7.2 10*3/uL (ref 4.0–10.5)
nRBC: 0 % (ref 0.0–0.2)

## 2019-01-27 LAB — HEPARIN LEVEL (UNFRACTIONATED): Heparin Unfractionated: 0.4 IU/mL (ref 0.30–0.70)

## 2019-01-27 SURGERY — LEFT HEART CATH AND CORONARY ANGIOGRAPHY
Anesthesia: LOCAL

## 2019-01-27 MED ORDER — VERAPAMIL HCL 2.5 MG/ML IV SOLN
INTRAVENOUS | Status: DC | PRN
  Administered 2019-01-27: 10 mL via INTRA_ARTERIAL

## 2019-01-27 MED ORDER — MIDAZOLAM HCL 2 MG/2ML IJ SOLN
INTRAMUSCULAR | Status: DC | PRN
  Administered 2019-01-27: 1 mg via INTRAVENOUS

## 2019-01-27 MED ORDER — APIXABAN 5 MG PO TABS
5.0000 mg | ORAL_TABLET | Freq: Two times a day (BID) | ORAL | Status: DC
  Administered 2019-01-28: 06:00:00 5 mg via ORAL
  Filled 2019-01-27 (×2): qty 1

## 2019-01-27 MED ORDER — VERAPAMIL HCL 2.5 MG/ML IV SOLN
INTRAVENOUS | Status: AC
  Filled 2019-01-27: qty 2

## 2019-01-27 MED ORDER — IOHEXOL 350 MG/ML SOLN
INTRAVENOUS | Status: DC | PRN
  Administered 2019-01-27: 16:00:00 70 mL via INTRAVENOUS

## 2019-01-27 MED ORDER — ASPIRIN 81 MG PO CHEW
81.0000 mg | CHEWABLE_TABLET | Freq: Every day | ORAL | Status: DC
  Administered 2019-01-28: 81 mg via ORAL
  Filled 2019-01-27: qty 1

## 2019-01-27 MED ORDER — ONDANSETRON HCL 4 MG/2ML IJ SOLN: 4.0000 mg | Freq: Four times a day (QID) | INTRAMUSCULAR | Status: DC | PRN

## 2019-01-27 MED ORDER — FENTANYL CITRATE (PF) 100 MCG/2ML IJ SOLN
INTRAMUSCULAR | Status: AC
  Filled 2019-01-27: qty 2

## 2019-01-27 MED ORDER — SODIUM CHLORIDE 0.9 % IV SOLN: 250.0000 mL | INTRAVENOUS | Status: DC | PRN

## 2019-01-27 MED ORDER — ACETAMINOPHEN 325 MG PO TABS: 650.0000 mg | ORAL_TABLET | ORAL | Status: DC | PRN

## 2019-01-27 MED ORDER — SODIUM CHLORIDE 0.9 % IV SOLN: INTRAVENOUS | Status: AC

## 2019-01-27 MED ORDER — HEPARIN (PORCINE) 25000 UT/250ML-% IV SOLN
2150.0000 [IU]/h | INTRAVENOUS | Status: DC
  Administered 2019-01-28: 2150 [IU]/h via INTRAVENOUS

## 2019-01-27 MED ORDER — HYDRALAZINE HCL 20 MG/ML IJ SOLN: 10.0000 mg | INTRAMUSCULAR | Status: AC | PRN

## 2019-01-27 MED ORDER — HEPARIN (PORCINE) IN NACL 1000-0.9 UT/500ML-% IV SOLN
INTRAVENOUS | Status: DC | PRN
  Administered 2019-01-27 (×2): 500 mL

## 2019-01-27 MED ORDER — LIDOCAINE HCL (PF) 1 % IJ SOLN
INTRAMUSCULAR | Status: AC
  Filled 2019-01-27: qty 30

## 2019-01-27 MED ORDER — LIDOCAINE HCL (PF) 1 % IJ SOLN
INTRAMUSCULAR | Status: DC | PRN
  Administered 2019-01-27: 2 mL

## 2019-01-27 MED ORDER — LABETALOL HCL 5 MG/ML IV SOLN: 10.0000 mg | INTRAVENOUS | Status: AC | PRN

## 2019-01-27 MED ORDER — FENTANYL CITRATE (PF) 100 MCG/2ML IJ SOLN
INTRAMUSCULAR | Status: DC | PRN
  Administered 2019-01-27: 25 ug via INTRAVENOUS

## 2019-01-27 MED ORDER — HEPARIN (PORCINE) IN NACL 1000-0.9 UT/500ML-% IV SOLN
INTRAVENOUS | Status: AC
  Filled 2019-01-27: qty 1000

## 2019-01-27 MED ORDER — SODIUM CHLORIDE 0.9% FLUSH
3.0000 mL | Freq: Two times a day (BID) | INTRAVENOUS | Status: DC
  Administered 2019-01-28 (×2): 3 mL via INTRAVENOUS

## 2019-01-27 MED ORDER — MIDAZOLAM HCL 2 MG/2ML IJ SOLN
INTRAMUSCULAR | Status: AC
  Filled 2019-01-27: qty 2

## 2019-01-27 MED ORDER — SODIUM CHLORIDE 0.9% FLUSH: 3.0000 mL | INTRAVENOUS | Status: DC | PRN

## 2019-01-27 MED ORDER — HEPARIN SODIUM (PORCINE) 1000 UNIT/ML IJ SOLN
INTRAMUSCULAR | Status: DC | PRN
  Administered 2019-01-27: 6000 [IU] via INTRAVENOUS

## 2019-01-27 SURGICAL SUPPLY — 11 items
CATH 5FR JL3.5 JR4 ANG PIG MP (CATHETERS) ×1 IMPLANT
COVER DOME SNAP 22 D (MISCELLANEOUS) ×1 IMPLANT
DEVICE RAD COMP TR BAND LRG (VASCULAR PRODUCTS) ×1 IMPLANT
GLIDESHEATH SLEND A-KIT 6F 22G (SHEATH) ×1 IMPLANT
GUIDEWIRE INQWIRE 1.5J.035X260 (WIRE) IMPLANT
INQWIRE 1.5J .035X260CM (WIRE) ×2
KIT HEART LEFT (KITS) ×2 IMPLANT
PACK CARDIAC CATHETERIZATION (CUSTOM PROCEDURE TRAY) ×2 IMPLANT
SHEATH PROBE COVER 6X72 (BAG) ×1 IMPLANT
TRANSDUCER W/STOPCOCK (MISCELLANEOUS) ×2 IMPLANT
TUBING CIL FLEX 10 FLL-RA (TUBING) ×2 IMPLANT

## 2019-01-27 NOTE — Progress Notes (Addendum)
Progress Note  Patient Name: Gary Romero Date of Encounter: 01/27/2019  Primary Cardiologist: New to [facility] (Dr. Delton See)  Subjective   No significant overnight events. Patient denies any chest pain, shortness of breath, or palpitations. He is scheduled for a cardiac catheterization later today. He has no questions or concerns about the procedure at this time.   Inpatient Medications    Scheduled Meds: . aspirin EC  81 mg Oral Daily  . atorvastatin  80 mg Oral q1800  . isosorbide mononitrate  30 mg Oral Daily  . lisinopril  5 mg Oral Daily  . metoprolol tartrate  50 mg Oral BID  . sodium chloride flush  3 mL Intravenous Once  . sodium chloride flush  3 mL Intravenous Q12H  . sodium chloride flush  3 mL Intravenous Q12H   Continuous Infusions: . sodium chloride    . sodium chloride 75 mL/hr at 01/27/19 0027  . sodium chloride    . sodium chloride 1 mL/kg/hr (01/27/19 0736)  . heparin 2,150 Units/hr (01/27/19 0259)   PRN Meds: sodium chloride, sodium chloride, acetaminophen, nitroGLYCERIN, ondansetron (ZOFRAN) IV, sodium chloride flush, sodium chloride flush   Vital Signs    Vitals:   01/27/19 0619 01/27/19 0624 01/27/19 0815 01/27/19 0858  BP: 126/82  (!) 154/92 123/85  Pulse:  66 66 64  Resp:    18  Temp:  98 F (36.7 C)  98.2 F (36.8 C)  TempSrc:  Oral  Oral  SpO2:  96%  95%  Weight: 122.6 kg     Height:        Intake/Output Summary (Last 24 hours) at 01/27/2019 0901 Last data filed at 01/27/2019 9735 Gross per 24 hour  Intake 2641.05 ml  Output 1500 ml  Net 1141.05 ml   Last 3 Weights 01/27/2019 01/26/2019 01/25/2019  Weight (lbs) 270 lb 3.2 oz 270 lb 11.2 oz 269 lb 11.2 oz  Weight (kg) 122.562 kg 122.789 kg 122.335 kg      Telemetry    Normal sinus rhythm. - Personally Reviewed  ECG    No new ECG tracing today. - Personally Reviewed  Physical Exam   GEN: No acute distress.   Neck: Supple. Cardiac: RRR. No murmurs, rubs, or gallops.   Respiratory: Clear to auscultation bilaterally. No wheezes, rhonchi, or rales. GI: Soft, nontender, non-distended. Bowel sounds present. MS: No edema. Radial and distal pedal pulses 2+ and equal bilaterally. Neuro:  No focal deficits. Psych: Normal affect.  Labs    Chemistry Recent Labs  Lab 01/23/19 1553  01/25/19 0553 01/26/19 0432 01/27/19 0444  NA  --    < > 134* 134* 136  K  --    < > 3.9 4.2 4.4  CL  --    < > 99 102 103  CO2  --    < > 25 26 24   GLUCOSE  --    < > 110* 106* 101*  BUN  --    < > 16 14 12   CREATININE  --    < > 1.17 1.11 1.07  CALCIUM  --    < > 8.6* 8.5* 8.6*  PROT 7.9  --   --   --   --   ALBUMIN 4.6  --   --   --   --   AST 80*  --   --   --   --   ALT 35  --   --   --   --  ALKPHOS 84  --   --   --   --   BILITOT 1.2  --   --   --   --   GFRNONAA  --    < > >60 >60 >60  GFRAA  --    < > >60 >60 >60  ANIONGAP  --    < > 10 6 9   < > = values in this interval not displayed.     Hematology Recent Labs  Lab 01/25/19 0553 01/26/19 0432 01/27/19 0444  WBC 10.1 8.1 7.2  RBC 4.89 4.81 4.73  HGB 14.5 14.5 14.0  HCT 42.5 41.5 41.2  MCV 86.9 86.3 87.1  MCH 29.7 30.1 29.6  MCHC 34.1 34.9 34.0  RDW 13.2 13.1 13.0  PLT 137* 145* 162    Cardiac Enzymes Recent Labs  Lab 01/23/19 1535 01/23/19 2027 01/24/19 0245 01/24/19 0721  TROPONINI 7.20* 8.10* 8.37* 7.02*   No results for input(s): TROPIPOC in the last 168 hours.   BNP Recent Labs  Lab 01/23/19 1554  BNP 226.3*     DDimer No results for input(s): DDIMER in the last 168 hours.   Radiology    No results found.  Cardiac Studies   Echocardiogram 01/24/2019: Impressions:  1. The left ventricle has normal systolic function, with an ejection fraction of 55-60%. The cavity size was normal. Left ventricular diastolic Doppler parameters are consistent with pseudonormalization.  2. The right ventricle has normal systolic function. The cavity was normal. There is no increase in right  ventricular wall thickness.  3. Trivial pericardial effusion is present.  4. The interatrial septum was not assessed.  Patient Profile   Gary Romero is a 68 y.o. 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 He was also found to be in atrial fibrillation in the ED.  Assessment & Plan    NSTEMI - Troponin peaked at 8.37 and is now down-trending. - Echo showed LVEF of 55-60% with no regional wall motion abnormalities. - Continue IV Heparin. - IV Nitro was switched to Imdur 30mg daily yesterday. - Continue Aspirin and high-intensity statin. - Plan is for cardiac catheterization later today.  Paroxysmal Atrial Fibrillation - Patient found to be in atrial fibrillation with RVR in the ED. He was started on IV Cardizem and spontaneously converted back to sinus rhythm. - Maintaining sinus rhythm. - Continue Lopressor 50mg twice daily. - CHA2DS2-VASc = 4. Continue IV Heparin. Will need to be switched to NOAC after cardiac catheterization.  Hypertension - Most recent BP 123/85. - Continue Lisinopril 5mg daily, Lopressor 50mg twice daily, and Imdur 30mg daily.  Hyperlipidemia  - Lipid panel this admission: Cholesterol 162, Triglycerides 120, HDL 52, LDL 86.  - Continue Lipitor 80mg daily.  For questions or updates, please contact [facility] Please consult [email or web address] for contact info under        Signed, Callie E Goodrich, PA-C  01/27/2019, 9:01 AM    Patient examined chart reviewed. Back in NSR for cath today risks discussed willing to proceed good right radial pulse. Clear lungs no murmur on high dose lipitor start DOAC post cath  [name]  

## 2019-01-27 NOTE — H&P (View-Only) (Signed)
Progress Note  Patient Name: Gary Romero Date of Encounter: 01/27/2019  Primary Cardiologist: New to [facility] (Dr. Delton See)  Subjective   No significant overnight events. Patient denies any chest pain, shortness of breath, or palpitations. He is scheduled for a cardiac catheterization later today. He has no questions or concerns about the procedure at this time.   Inpatient Medications    Scheduled Meds: . aspirin EC  81 mg Oral Daily  . atorvastatin  80 mg Oral q1800  . isosorbide mononitrate  30 mg Oral Daily  . lisinopril  5 mg Oral Daily  . metoprolol tartrate  50 mg Oral BID  . sodium chloride flush  3 mL Intravenous Once  . sodium chloride flush  3 mL Intravenous Q12H  . sodium chloride flush  3 mL Intravenous Q12H   Continuous Infusions: . sodium chloride    . sodium chloride 75 mL/hr at 01/27/19 0027  . sodium chloride    . sodium chloride 1 mL/kg/hr (01/27/19 0736)  . heparin 2,150 Units/hr (01/27/19 0259)   PRN Meds: sodium chloride, sodium chloride, acetaminophen, nitroGLYCERIN, ondansetron (ZOFRAN) IV, sodium chloride flush, sodium chloride flush   Vital Signs    Vitals:   01/27/19 0619 01/27/19 0624 01/27/19 0815 01/27/19 0858  BP: 126/82  (!) 154/92 123/85  Pulse:  66 66 64  Resp:    18  Temp:  98 F (36.7 C)  98.2 F (36.8 C)  TempSrc:  Oral  Oral  SpO2:  96%  95%  Weight: 122.6 kg     Height:        Intake/Output Summary (Last 24 hours) at 01/27/2019 0901 Last data filed at 01/27/2019 9735 Gross per 24 hour  Intake 2641.05 ml  Output 1500 ml  Net 1141.05 ml   Last 3 Weights 01/27/2019 01/26/2019 01/25/2019  Weight (lbs) 270 lb 3.2 oz 270 lb 11.2 oz 269 lb 11.2 oz  Weight (kg) 122.562 kg 122.789 kg 122.335 kg      Telemetry    Normal sinus rhythm. - Personally Reviewed  ECG    No new ECG tracing today. - Personally Reviewed  Physical Exam   GEN: No acute distress.   Neck: Supple. Cardiac: RRR. No murmurs, rubs, or gallops.   Respiratory: Clear to auscultation bilaterally. No wheezes, rhonchi, or rales. GI: Soft, nontender, non-distended. Bowel sounds present. MS: No edema. Radial and distal pedal pulses 2+ and equal bilaterally. Neuro:  No focal deficits. Psych: Normal affect.  Labs    Chemistry Recent Labs  Lab 01/23/19 1553  01/25/19 0553 01/26/19 0432 01/27/19 0444  NA  --    < > 134* 134* 136  K  --    < > 3.9 4.2 4.4  CL  --    < > 99 102 103  CO2  --    < > 25 26 24   GLUCOSE  --    < > 110* 106* 101*  BUN  --    < > 16 14 12   CREATININE  --    < > 1.17 1.11 1.07  CALCIUM  --    < > 8.6* 8.5* 8.6*  PROT 7.9  --   --   --   --   ALBUMIN 4.6  --   --   --   --   AST 80*  --   --   --   --   ALT 35  --   --   --   --  ALKPHOS 84  --   --   --   --   BILITOT 1.2  --   --   --   --   GFRNONAA  --    < > >60 >60 >60  GFRAA  --    < > >60 >60 >60  ANIONGAP  --    < > 10 6 9    < > = values in this interval not displayed.     Hematology Recent Labs  Lab 01/25/19 0553 01/26/19 0432 01/27/19 0444  WBC 10.1 8.1 7.2  RBC 4.89 4.81 4.73  HGB 14.5 14.5 14.0  HCT 42.5 41.5 41.2  MCV 86.9 86.3 87.1  MCH 29.7 30.1 29.6  MCHC 34.1 34.9 34.0  RDW 13.2 13.1 13.0  PLT 137* 145* 162    Cardiac Enzymes Recent Labs  Lab 01/23/19 1535 01/23/19 2027 01/24/19 0245 01/24/19 0721  TROPONINI 7.20* 8.10* 8.37* 7.02*   No results for input(s): TROPIPOC in the last 168 hours.   BNP Recent Labs  Lab 01/23/19 1554  BNP 226.3*     DDimer No results for input(s): DDIMER in the last 168 hours.   Radiology    No results found.  Cardiac Studies   Echocardiogram 01/24/2019: Impressions:  1. The left ventricle has normal systolic function, with an ejection fraction of 55-60%. The cavity size was normal. Left ventricular diastolic Doppler parameters are consistent with pseudonormalization.  2. The right ventricle has normal systolic function. The cavity was normal. There is no increase in right  ventricular wall thickness.  3. Trivial pericardial effusion is present.  4. The interatrial septum was not assessed.  Patient Profile   Mr. Riley LamDouglas is a 68 y.o. 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 He was also found to be in atrial fibrillation in the ED.  Assessment & Plan    NSTEMI - Troponin peaked at 8.37 and is now down-trending. - Echo showed LVEF of 55-60% with no regional wall motion abnormalities. - Continue IV Heparin. - IV Nitro was switched to Imdur 30mg  daily yesterday. - Continue Aspirin and high-intensity statin. - Plan is for cardiac catheterization later today.  Paroxysmal Atrial Fibrillation - Patient found to be in atrial fibrillation with RVR in the ED. He was started on IV Cardizem and spontaneously converted back to sinus rhythm. - Maintaining sinus rhythm. - Continue Lopressor 50mg  twice daily. - CHA2DS2-VASc = 4. Continue IV Heparin. Will need to be switched to NOAC after cardiac catheterization.  Hypertension - Most recent BP 123/85. - Continue Lisinopril 5mg  daily, Lopressor 50mg  twice daily, and Imdur 30mg  daily.  Hyperlipidemia  - Lipid panel this admission: Cholesterol 162, Triglycerides 120, HDL 52, LDL 86.  - Continue Lipitor 80mg  daily.  For questions or updates, please contact [facility] Please consult [email or web address] for contact info under        Signed, Corrin ParkerCallie E Goodrich, PA-C  01/27/2019, 9:01 AM    Patient examined chart reviewed. Back in NSR for cath today risks discussed willing to proceed good right radial pulse. Clear lungs no murmur on high dose lipitor start DOAC post cath  Regions Financial CorporationPeter Saisha Hogue

## 2019-01-27 NOTE — CV Procedure (Signed)
   Coronary angiography and left heart via right radial approach using ultrasound guidance.  Chronic total occlusion of LAD.  Recent occlusion of circumflex.  Hyperdominant RCA with large PDA that wraps around the apex.  Ostial and proximal PDA 50 to 70% stenoses.  Supplies collaterals to LAD which is a small vessel and very late collaterals to the obtuse marginal beyond the total occlusion of the circumflex.  Normal left main  LVEDP mildly elevated at 20.  LVEF 50%.

## 2019-01-27 NOTE — Progress Notes (Signed)
ANTICOAGULATION CONSULT NOTE - Follow Up Consult  Pharmacy Consult for Heparin Indication: chest pain/ACS  No Known Allergies  Patient Measurements: Height: 6' (182.9 cm) Weight: 270 lb 3.2 oz (122.6 kg) IBW/kg (Calculated) : 77.6 Heparin Dosing Weight: 105.4 kg  Vital Signs: Temp: 98.2 F (36.8 C) (05/26 0858) Temp Source: Oral (05/26 0858) BP: 123/85 (05/26 0858) Pulse Rate: 64 (05/26 0858)  Labs: Recent Labs    01/25/19 0553 01/26/19 0432 01/27/19 0444  HGB 14.5 14.5 14.0  HCT 42.5 41.5 41.2  PLT 137* 145* 162  HEPARINUNFRC 0.28* 0.32 0.40  CREATININE 1.17 1.11 1.07    Estimated Creatinine Clearance: 91.8 mL/min (by C-G formula based on SCr of 1.07 mg/dL).   Assessment: Gary Romero is a 68yo male admitted with chest pain/ACS in the setting of afib RVR. Pharmacy consulted to start IV heparin.   Heparin level is therapeutic at 0.4 on 2150 units/hr. No bleeding noted, CBC is stable.   Goal of Therapy:  Heparin level 0.3-0.7 units/ml Monitor platelets by anticoagulation protocol: Yes   Plan:  Continue heparin drip at 2150 units/hr Daily heparin level, CBC   F/U after cath F/U plan to start DOAC post-cath  Thank you for involving pharmacy in this patient's care.  Loura Back, PharmD, BCPS Clinical Pharmacist Clinical phone for 01/27/2019 until 3p is [phone] 01/27/2019 1:23 PM  **Pharmacist phone directory can now be found on [email or web address] listed under [facility]**

## 2019-01-27 NOTE — Interval H&P Note (Signed)
Cath Lab Visit (complete for each Cath Lab visit)  Clinical Evaluation Leading to the Procedure:   ACS: Yes.    Non-ACS:    Anginal Classification: CCS III  Anti-ischemic medical therapy: Maximal Therapy (2 or more classes of medications)  Non-Invasive Test Results: No non-invasive testing performed  Prior CABG: No previous CABG      History and Physical Interval Note:  01/27/2019 3:20 PM  Gary Romero  has presented today for surgery, with the diagnosis of unstable angina.  The various methods of treatment have been discussed with the patient and family. After consideration of risks, benefits and other options for treatment, the patient has consented to  Procedure(s): LEFT HEART CATH AND CORONARY ANGIOGRAPHY (N/A) as a surgical intervention.  The patient's history has been reviewed, patient examined, no change in status, stable for surgery.  I have reviewed the patient's chart and labs.  Questions were answered to the patient's satisfaction.     Lyn Records III

## 2019-01-27 NOTE — Progress Notes (Signed)
ANTICOAGULATION CONSULT NOTE - Follow Up Consult  Pharmacy Consult for Heparin Indication: chest pain/ACS  No Known Allergies  Patient Measurements: Height: 6' (182.9 cm) Weight: 270 lb 3.2 oz (122.6 kg) IBW/kg (Calculated) : 77.6 Heparin Dosing Weight: 105.4 kg  Vital Signs: Temp: 98.1 F (36.7 C) (05/26 1200) Temp Source: Oral (05/26 1200) BP: 117/77 (05/26 1558) Pulse Rate: 65 (05/26 1558)  Labs: Recent Labs    01/25/19 0553 01/26/19 0432 01/27/19 0444  HGB 14.5 14.5 14.0  HCT 42.5 41.5 41.2  PLT 137* 145* 162  HEPARINUNFRC 0.28* 0.32 0.40  CREATININE 1.17 1.11 1.07    Estimated Creatinine Clearance: 91.8 mL/min (by C-G formula based on SCr of 1.07 mg/dL).   Assessment:  68yo male admitted with chest pain/ACS in the setting of afib RVR. Pharmacy consulted to start IV heparin. Pt underwent coronary angiography today, to resume IV heparin tonight 8hr after sheath pull then transition to apixaban 12 hr after sheath pull.   Goal of Therapy:  Heparin level 0.3-0.7 units/ml Monitor platelets by anticoagulation protocol: Yes   Plan:  Resume heparin 2150 units/hr no bolus at midnight Stop heparin at 0600 and begin apixaban 5mg  BID Will defer further heparin levels and pharmacy will sign off   Fredonia Highland, PharmD, BCPS Clinical Pharmacist [phone] Please check AMION for all [facility] numbers 01/27/2019

## 2019-01-28 ENCOUNTER — Other Ambulatory Visit: Payer: Self-pay | Admitting: Student

## 2019-01-28 ENCOUNTER — Encounter (HOSPITAL_COMMUNITY): Payer: Self-pay | Admitting: Interventional Cardiology

## 2019-01-28 DIAGNOSIS — I25119 Atherosclerotic heart disease of native coronary artery with unspecified angina pectoris: Secondary | ICD-10-CM

## 2019-01-28 DIAGNOSIS — I2510 Atherosclerotic heart disease of native coronary artery without angina pectoris: Secondary | ICD-10-CM

## 2019-01-28 LAB — BASIC METABOLIC PANEL
Anion gap: 10 (ref 5–15)
BUN: 12 mg/dL (ref 8–23)
CO2: 22 mmol/L (ref 22–32)
Calcium: 8.8 mg/dL — ABNORMAL LOW (ref 8.9–10.3)
Chloride: 103 mmol/L (ref 98–111)
Creatinine, Ser: 1.05 mg/dL (ref 0.61–1.24)
GFR calc Af Amer: >60 mL/min (ref >60)
GFR calc non Af Amer: >60 mL/min (ref >60)
Glucose, Bld: 105 mg/dL — ABNORMAL HIGH (ref 70–99)
Potassium: 4.6 mmol/L (ref 3.5–5.1)
Sodium: 135 mmol/L (ref 135–145)

## 2019-01-28 LAB — CBC
HCT: 39.9 % (ref 39.0–52.0)
Hemoglobin: 13.7 g/dL (ref 13.0–17.0)
MCH: 29.7 pg (ref 26.0–34.0)
MCHC: 34.3 g/dL (ref 30.0–36.0)
MCV: 86.6 fL (ref 80.0–100.0)
Platelets: 158 10*3/uL (ref 150–400)
RBC: 4.61 MIL/uL (ref 4.22–5.81)
RDW: 12.9 % (ref 11.5–15.5)
WBC: 7.4 10*3/uL (ref 4.0–10.5)
nRBC: 0 % (ref 0.0–0.2)

## 2019-01-28 LAB — NOVEL CORONAVIRUS, NAA (HOSP ORDER, SEND-OUT TO REF LAB; TAT 18-24 HRS): SARS-CoV-2, NAA: NOT DETECTED

## 2019-01-28 MED ORDER — APIXABAN 5 MG PO TABS: 5.0000 mg | ORAL_TABLET | Freq: Two times a day (BID) | ORAL | 2 refills | Status: DC

## 2019-01-28 MED ORDER — LISINOPRIL 10 MG PO TABS: 10.0000 mg | ORAL_TABLET | Freq: Every day | ORAL | 2 refills | Status: DC

## 2019-01-28 MED ORDER — ASPIRIN 81 MG PO CHEW: 81.0000 mg | CHEWABLE_TABLET | Freq: Every day | ORAL | Status: AC

## 2019-01-28 MED ORDER — NITROGLYCERIN 0.4 MG SL SUBL: 0.4000 mg | SUBLINGUAL_TABLET | SUBLINGUAL | 0 refills | Status: DC | PRN

## 2019-01-28 MED ORDER — METOPROLOL TARTRATE 50 MG PO TABS: 50.0000 mg | ORAL_TABLET | Freq: Two times a day (BID) | ORAL | 2 refills | Status: DC

## 2019-01-28 MED ORDER — ATORVASTATIN CALCIUM 80 MG PO TABS: 80.0000 mg | ORAL_TABLET | Freq: Every day | ORAL | 2 refills | Status: DC

## 2019-01-28 MED ORDER — ISOSORBIDE MONONITRATE ER 30 MG PO TB24: 30.0000 mg | ORAL_TABLET | Freq: Every day | ORAL | 2 refills | Status: DC

## 2019-01-28 MED FILL — NITROGLYCERIN 0.4 MG TAB SL: 0.4 | 8 days supply | Qty: 25 | Fill #0

## 2019-01-28 MED FILL — ISOSORBIDE MN ER 30 MG TAB: 30 | 30 days supply | Qty: 30 | Fill #0

## 2019-01-28 MED FILL — ATORVASTATIN CALCIUM 80 MG: 80 | 30 days supply | Qty: 30 | Fill #0

## 2019-01-28 MED FILL — LISINOPRIL 10 MG TABS: 10 | 30 days supply | Qty: 30 | Fill #0

## 2019-01-28 MED FILL — ELIQUIS 5 MG TABLET: 5 | 30 days supply | Qty: 60 | Fill #0

## 2019-01-28 MED FILL — METOPROLOL TARTRATE 50 MG T: 50 | 30 days supply | Qty: 60 | Fill #0

## 2019-01-28 NOTE — Progress Notes (Addendum)
Progress Note  Patient Name: Gary SorrowRonnie O Mollica Date of Encounter: 01/28/2019  Primary Cardiologist: [facility] (Dr. Delton SeeNelson)  Subjective   No significant overnight events. Patient reports minimal chest tightness that he ranks as a 2/10 but states it does not hurt bad. He denies he shortness of breath, palpitations, lightheadedness, or dizziness. He has been able to ambulate to the restroom without any difficulty. Patient seems very discouraged that there was nothing that could be "fixed" yesterday during cardiac catheterization.  Inpatient Medications    Scheduled Meds: . apixaban  5 mg Oral BID  . aspirin  81 mg Oral Daily  . atorvastatin  80 mg Oral q1800  . isosorbide mononitrate  30 mg Oral Daily  . lisinopril  5 mg Oral Daily  . metoprolol tartrate  50 mg Oral BID  . sodium chloride flush  3 mL Intravenous Once  . sodium chloride flush  3 mL Intravenous Q12H  . sodium chloride flush  3 mL Intravenous Q12H   Continuous Infusions: . sodium chloride     PRN Meds: sodium chloride, acetaminophen, nitroGLYCERIN, ondansetron (ZOFRAN) IV, sodium chloride flush   Vital Signs    Vitals:   01/27/19 1558 01/27/19 2033 01/28/19 0646 01/28/19 0647  BP: 117/77 131/72 136/81   Pulse: 65 70 89   Resp: 20 16 (!) 23 20  Temp:  98.1 F (36.7 C) 98 F (36.7 C)   TempSrc:  Oral Oral   SpO2: 97% 97% 96%   Weight:    122.2 kg  Height:        Intake/Output Summary (Last 24 hours) at 01/28/2019 0721 Last data filed at 01/28/2019 0200 Gross per 24 hour  Intake 2614.7 ml  Output 450 ml  Net 2164.7 ml   Last 3 Weights 01/28/2019 01/27/2019 01/26/2019  Weight (lbs) 269 lb 6.4 oz 270 lb 3.2 oz 270 lb 11.2 oz  Weight (kg) 122.199 kg 122.562 kg 122.789 kg      Telemetry    Normal sinus rhythm with rates in the 60's to 80's. - Personally Reviewed  ECG    No new ECG tracing today. - Personally Reviewed  Physical Exam   GEN: Caucasian male resting comfortably in no acute distress.    Neck: Supple. Cardiac: RRR. No murmurs, rubs, or gallops. Radial and distal pedal pulses 2+ and equal bilaterally. Right radial cath site soft with no signs of hematoma. Respiratory: Clear to auscultation bilaterally. No wheezes, rhonchi, or rales. GI: Soft, non-tender, non-distended. Bowel sounds present in all 4 quadrants. MS: No edema.  Neuro:  No focal deficits. Psych: Normal affect.  Labs    Chemistry Recent Labs  Lab 01/23/19 1553  01/25/19 0553 01/26/19 0432 01/27/19 0444  NA  --    < > 134* 134* 136  K  --    < > 3.9 4.2 4.4  CL  --    < > 99 102 103  CO2  --    < > 25 26 24   GLUCOSE  --    < > 110* 106* 101*  BUN  --    < > 16 14 12   CREATININE  --    < > 1.17 1.11 1.07  CALCIUM  --    < > 8.6* 8.5* 8.6*  PROT 7.9  --   --   --   --   ALBUMIN 4.6  --   --   --   --   AST 80*  --   --   --   --  ALT 35  --   --   --   --   ALKPHOS 84  --   --   --   --   BILITOT 1.2  --   --   --   --   GFRNONAA  --    < > >60 >60 >60  GFRAA  --    < > >60 >60 >60  ANIONGAP  --    < > < > = values in this interval not displayed.     Hematology Recent Labs  Lab 01/26/19 0432 01/27/19 0444 01/28/19 0605  WBC 8.1 7.2 7.4  RBC 4.81 4.73 4.61  HGB 14.5 14.0 13.7  HCT 41.5 41.2 39.9  MCV 86.3 87.1 86.6  MCH 30.1 29.6 29.7  MCHC 34.9 34.0 34.3  RDW 13.1 13.0 12.9  PLT 145* 162 158    Cardiac Enzymes Recent Labs  Lab 01/23/19 1535 01/23/19 2027 01/24/19 0245 01/24/19 0721  TROPONINI 7.20* 8.10* 8.37* 7.02*   No results for input(s): TROPIPOC in the last 168 hours.   BNP Recent Labs  Lab 01/23/19 1554  BNP 226.3*     DDimer No results for input(s): DDIMER in the last 168 hours.   Radiology    No results found.  Cardiac Studies   Echocardiogram 01/24/2019: Impressions:  1. The left ventricle has normal systolic function, with an ejection fraction of 55-60%. The cavity size was normal. Left ventricular diastolic Doppler parameters are consistent  with pseudonormalization.  2. The right ventricle has normal systolic function. The cavity was normal. There is no increase in right ventricular wall thickness.  3. Trivial pericardial effusion is present.  4. The interatrial septum was not assessed. _______________  Left Heart Catheterization 01/27/2019:  Total occlusion mid LAD beyond the first diagonal (chronic total).  Relatively well-formed collaterals from PDA.  The first diagonal is relatively large.  Total occlusion proximal to mid circumflex (appears to be a more recent occlusion).  Very faint collaterals are noted.  Left main is widely patent.  Widely patent ramus intermedius.  Hyperdominant RCA with irregularities noted throughout the proximal mid and distal vessel.  PDA ostium contains 60% eccentric narrowing, proximal P DA contains 50% narrowing, mid PDA contains 50% narrowing.  PDA supplies collaterals around the apex to the LAD.  Continuation of the RCA before 2 marginal branches contain 70% narrowing.  Overall preserved LV function with EF 55%.  Anterior wall apex and inferoapical region revealed normal contractility.  Inferobasal region is now well identified.  EDP 19 mmHg.  Recommendations:  Medical therapy for underlying coronary disease.  If anginal complaints despite medical therapy, consider mechanical revascularization with CABG as a means of more total revascularization versus multisite PCI on the right coronary (less appealing).  Begin apixaban 12 hours post cath.  In interim we will restart IV heparin until apixaban can be given.  Once apixaban started, heparin will be discontinued.  Patient Profile   Mr. Mederos is a 68 y.o. 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 He was also found to be in atrial fibrillation in the ED.  Assessment & Plan    NSTEMI - Troponin peaked at 8.37 and is now down-trending. - Echo showed LVEF of 55-60% with  no regional wall motion abnormalities. - Left heart catheterization yesterday showed chronic total occlusion of the mid LAD with well-formed collaterals from PDA and total occlusion  of proximal to mid circumflex which appears more recent as well as a hyperdominant RCA with irregularities noted throughout the proximal, mid, and distal vessel. See full report above. Continued medical therapy was recommended. If patient continues to have chest pain, consider mechanical revascularization with CABG vs multi-site PCI of RCA (less appealing). - Patient reports minimally chest tightness this morning that he ranks as a 2/10. He seems very discouraged that there was no interventions performed yesterday. - Continue aspirin, beta-blocker, and high-intensity statin. - Continue Imdur 30mg  daily. - May be able to be discharged today. MD to see.  Paroxysmal Atrial Fibrillation - Patient found to be in atrial fibrillation with RVR in the ED. He was started on IV Cardizem and spontaneously converted back to sinus rhythm. - Maintaining sinus rhythm. - Continue Lopressor 50mg  twice daily. - CHA2DS2-VASc = 4. Continue Eliquis 5mg  twice daily.  Hypertension - Most recent BP 136/81. - Continue Lisinopril 5mg  daily, Lopressor 50mg  twice daily, and Imdur 30mg  daily. Consider increasing Lisinopril.  Hyperlipidemia  - Lipid panel this admission: Cholesterol 162, Triglycerides 120, HDL 52, LDL 86.  - Continue Lipitor 80mg  daily (started this admission). - Will need repeat lipid panel and CMP in 6 weeks.  For questions or updates, please contact [facility] Please consult [email or web address] for contact info under        Signed, Corrin Parker, PA-C  01/28/2019, 7:21 AM    Patient examined chart reviewed Cath sight A no hematoma Long discussion about collaterals and stability of total occlusions. Reviewed angio and tightest lesions in RCA are distal PLB/PDA. Increase lisinopril for BP continue beta blocker and  nitrates would do Lexiscan myovue in 6-8 weeks to assess ischemic burden. If he fails medical RX will need CABG.  Ok for d/c home today  Charlton Haws

## 2019-01-28 NOTE — Discharge Instructions (Signed)
Post Cardiac Catheterization: NO HEAVY LIFTING OR SEXUAL ACTIVITY X 7 DAYS. NO DRIVING X 2-3 DAYS. NO SOAKING BATHS, HOT TUBS, POOLS, ETC., X 7 DAYS.  Radial Site Care: Refer to this sheet in the next few weeks. These instructions provide you with information on caring for yourself after your procedure. Your caregiver may also give you more specific instructions. Your treatment has been planned according to current medical practices, but problems sometimes occur. Call your caregiver if you have any problems or questions after your procedure. HOME CARE INSTRUCTIONS  You may shower the day after the procedure.Remove the bandage (dressing) and gently wash the site with plain soap and water.Gently pat the site dry.   Do not apply powder or lotion to the site.   Do not submerge the affected site in water for 3 to 5 days.   Inspect the site at least twice daily.   Do not flex or bend the affected arm for 24 hours.   No lifting over 5 pounds (2.3 kg) for 5 days after your procedure.   Do not drive home if you are discharged the same day of the procedure. Have someone else drive you.  What to expect:  Any bruising will usually fade within 1 to 2 weeks.   Blood that collects in the tissue (hematoma) may be painful to the touch. It should usually decrease in size and tenderness within 1 to 2 weeks.  SEEK IMMEDIATE MEDICAL CARE IF:  You have unusual pain at the radial site.   You have redness, warmth, swelling, or pain at the radial site.   You have drainage (other than a small amount of blood on the dressing).   You have chills.   You have a fever or persistent symptoms for more than 72 hours.   You have a fever and your symptoms suddenly get worse.   Your arm becomes pale, cool, tingly, or numb.   You have heavy bleeding from the site. Hold pressure on the site.   YOUR CARDIOLOGY TEAM HAS ARRANGED FOR AN E-VISIT FOR YOUR APPOINTMENT - PLEASE REVIEW IMPORTANT INFORMATION BELOW  SEVERAL DAYS PRIOR TO YOUR APPOINTMENT  Due to the recent COVID-19 pandemic, we are transitioning in-person office visits to tele-medicine visits in an effort to decrease unnecessary exposure to our patients, their families, and staff. These visits are billed to your insurance just like a normal visit is. We also encourage you to sign up for MyChart if you have not already done so. You will need a smartphone if possible. For patients that do not have this, we can still complete the visit using a regular telephone but do prefer a smartphone to enable video when possible. You may have a family member that lives with you that can help. If possible, we also ask that you have a blood pressure cuff and scale at home to measure your blood pressure, heart rate and weight prior to your scheduled appointment. Patients with clinical needs that need an in-person evaluation and testing will still be able to come to the office if absolutely necessary. If you have any questions, feel free to call our office.  YOUR PROVIDER WILL BE USING ONE OF THE FOLLOWING PLATFORM TO COMPLETE YOUR VISIT: MyChart/Doximity/[email or web address] (our office will call you a couple of days prior to your appointment with more information)   IF USING MYCHART - How to Download the MyChart App to Your SmartPhone   - If Apple, go to [employer] and type in [place] in  the search bar and download the app. If Android, ask patient to go to [employer] and type in [place] in the search bar and download the app. The app is free but as with any other app downloads, your phone may require you to verify saved payment information or Apple/Android password.  - You will need to then log into the app with your MyChart username and password, and select [facility] as your healthcare provider to link the account.  - When it is time for your visit, go to the MyChart app, find appointments, and click Begin Video Visit. Be sure to Select Allow for your device to access  the Microphone and Camera for your visit. You will then be connected, and your provider will be with you shortly.  **If you have any issues connecting or need assistance, please contact MyChart service desk [phone])**  **If using a computer, in order to ensure the best quality for your visit, you will need to use either of the following Internet Browsers: Agricultural consultant or Microsoft Edge**   IF USING DOXIMITY or [email or web address].ME - The staff will give you instructions on receiving your link to join the meeting the day of your visit.   2-3 DAYS BEFORE YOUR APPOINTMENT  You will receive a telephone call from one of our [facility] team members - your caller ID may say "Unknown caller." If this is a video visit, we will walk you through how to get the video launched on your phone. We will remind you check your blood pressure, heart rate and weight prior to your scheduled appointment. If you have an Apple Watch or Kardia, please upload any pertinent ECG strips the day before or morning of your appointment to MyChart. Our staff will also make sure you have reviewed the consent and agree to move forward with your scheduled tele-health visit.   THE DAY OF YOUR APPOINTMENT  Approximately 15 minutes prior to your scheduled appointment, you will receive a telephone call from one of [facility] team - your caller ID may say "Unknown caller."  Our staff will confirm medications, vital signs for the day and any symptoms you may be experiencing. Please have this information available prior to the time of visit start. It may also be helpful for you to have a pad of paper and pen handy for any instructions given during your visit. They will also walk you through joining the smartphone meeting if this is a video visit.  CONSENT FOR TELE-HEALTH VISIT - PLEASE REVIEW  I hereby voluntarily request, consent and authorize [facility] and its employed or contracted physicians, physician assistants, nurse  practitioners or other licensed health care professionals (the Practitioner), to provide me with telemedicine health care services (the Services") as deemed necessary by the treating Practitioner. I acknowledge and consent to receive the Services by the Practitioner via telemedicine. I understand that the telemedicine visit will involve communicating with the Practitioner through live audiovisual communication technology and the disclosure of certain medical information by electronic transmission. I acknowledge that I have been given the opportunity to request an in-person assessment or other available alternative prior to the telemedicine visit and am voluntarily participating in the telemedicine visit.  I understand that I have the right to withhold or withdraw my consent to the use of telemedicine in the course of my care at any time, without affecting my right to future care or treatment, and that the Practitioner or I may terminate the telemedicine visit at any time.  I understand that I have the right to inspect all information obtained and/or recorded in the course of the telemedicine visit and may receive copies of available information for a reasonable fee.  I understand that some of the potential risks of receiving the Services via telemedicine include:   Delay or interruption in medical evaluation due to technological equipment failure or disruption;  Information transmitted may not be sufficient (e.g. poor resolution of images) to allow for appropriate medical decision making by the Practitioner; and/or   In rare instances, security protocols could fail, causing a breach of personal health information.  Furthermore, I acknowledge that it is my responsibility to provide information about my medical history, conditions and care that is complete and accurate to the best of my ability. I acknowledge that Practitioner's advice, recommendations, and/or decision may be based on factors not within  their control, such as incomplete or inaccurate data provided by me or distortions of diagnostic images or specimens that may result from electronic transmissions. I understand that the practice of medicine is not an exact science and that Practitioner makes no warranties or guarantees regarding treatment outcomes. I acknowledge that I will receive a copy of this consent concurrently upon execution via email to the email address I last provided but may also request a printed copy by calling the office of [facility].    I understand that my insurance will be billed for this visit.   I have read or had this consent read to me.  I understand the contents of this consent, which adequately explains the benefits and risks of the Services being provided via telemedicine.   I have been provided ample opportunity to ask questions regarding this consent and the Services and have had my questions answered to my satisfaction.  I give my informed consent for the services to be provided through the use of telemedicine in my medical care  By participating in this telemedicine visit I agree to the above.   Information on my medicine - ELIQUIS (apixaban)   Why was Eliquis prescribed for you? Eliquis was prescribed for you to reduce the risk of a blood clot forming that can cause a stroke if you have a medical condition called atrial fibrillation (a type of irregular heartbeat).  What do You need to know about Eliquis ? Take your Eliquis TWICE DAILY - one tablet in the morning and one tablet in the evening with or without food. If you have difficulty swallowing the tablet whole please discuss with your pharmacist how to take the medication safely.  Take Eliquis exactly as prescribed by your doctor and DO NOT stop taking Eliquis without talking to the doctor who prescribed the medication.  Stopping may increase your risk of developing a stroke.  Refill your prescription before you run out.  After  discharge, you should have regular check-up appointments with your healthcare provider that is prescribing your Eliquis.  In the future your dose may need to be changed if your kidney function or weight changes by a significant amount or as you get older.  What do you do if you miss a dose? If you miss a dose, take it as soon as you remember on the same day and resume taking twice daily.  Do not take more than one dose of ELIQUIS at the same time to make up a missed dose.  Important Safety Information A possible side effect of Eliquis is bleeding. You should call your healthcare provider right away if you  experience any of the following: ? Bleeding from an injury or your nose that does not stop. ? Unusual colored urine (red or dark brown) or unusual colored stools (red or black). ? Unusual bruising for unknown reasons. ? A serious fall or if you hit your head (even if there is no bleeding).  Some medicines may interact with Eliquis and might increase your risk of bleeding or clotting while on Eliquis. To help avoid this, consult your healthcare provider or pharmacist prior to using any new prescription or non-prescription medications, including herbals, vitamins, non-steroidal anti-inflammatory drugs (NSAIDs) and supplements.  This website has more information on Eliquis (apixaban): http://www.eliquis.com/eliquis/home

## 2019-01-28 NOTE — Discharge Summary (Signed)
Discharge Summary    Patient ID: JABARI JOUETT MRN: [id number]; DOB: 1950-09-08  Admit date: 01/23/2019 Discharge date: 01/28/2019  Primary Care Provider: Patient, No Pcp Per  Primary Cardiologist: Tobias Alexander, MD  Primary Electrophysiologist:  None   Discharge Diagnoses    Principal Problem:   NSTEMI (non-ST elevated myocardial infarction) [facility]) Active Problems:   Atrial fibrillation with RVR (HCC)   Chest pain   Essential hypertension   Hyperlipidemia   CAD (coronary artery disease)   Allergies No Known Allergies  Diagnostic Studies/Procedures    Echocardiogram 01/24/2019: Impressions:  1. The left ventricle has normal systolic function, with an ejection fraction of 55-60%. The cavity size was normal. Left ventricular diastolic Doppler parameters are consistent with pseudonormalization.  2. The right ventricle has normal systolic function. The cavity was normal. There is no increase in right ventricular wall thickness.  3. Trivial pericardial effusion is present.  4. The interatrial septum was not assessed. _____________   Left Heart Catheterization 01/27/2019:  Total occlusion mid LAD beyond the first diagonal (chronic total).  Relatively well-formed collaterals from PDA.  The first diagonal is relatively large.  Total occlusion proximal to mid circumflex (appears to be a more recent occlusion).  Very faint collaterals are noted.  Left main is widely patent.  Widely patent ramus intermedius.  Hyperdominant RCA with irregularities noted throughout the proximal mid and distal vessel.  PDA ostium contains 60% eccentric narrowing, proximal P DA contains 50% narrowing, mid PDA contains 50% narrowing.  PDA supplies collaterals around the apex to the LAD.  Continuation of the RCA before 2 marginal branches contain 70% narrowing.  Overall preserved LV function with EF 55%.  Anterior wall apex and inferoapical region revealed normal contractility.  Inferobasal region  is now well identified.  EDP 19 mmHg.  Recommendations:  Medical therapy for underlying coronary disease.  If anginal complaints despite medical therapy, consider mechanical revascularization with CABG as a means of more total revascularization versus multisite PCI on the right coronary (less appealing).  Begin apixaban 12 hours post cath.  In interim we will restart IV heparin until apixaban can be given.  Once apixaban started, heparin will be discontinued.  History of Present Illness     Mr. Lai Venus is a 68 year old male with a history of known 3-vessel CAD with chronic total occlusion of LAD on cardiac catheterization in 2009 and hypertension who presented to the [facility] ED on 01/23/2019 for evaluation of chest pain and was found to be in atrial fibrillation with RVR.  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 He has a history of MI and multivessel CAD by catheterozation in 2009 which was treated medically. He says that he moved out of town shortly after 2009 and has not had any cardiac care since then. He has also not seen a PCP and is taking no medications. He only sees Dermatology for his hands.   Patient states that he was doing well with no chest pain or shortness of breath until the last couple of days prior to presentation. He began to feel indigestion like discomfort so he cut back on eating. On the before presentation, he had more chest pain and on the day of presentation his chest pain became severe and radiated a little to his right arm while he was driving. He also had some mild shortness of breath so he went to the ED for further evaluation.  In the ED, EKG initially showed atrial  fibrillation with RVR but he then converted to sinus rhythm with no significant abnormalities. Troponin elevated at 7.20. BNP elevated at 226.3. Chest x-ray showed no evidence of heart failure. WBC 14.8, Hgb 16.2, Plts 180. Na 132, K 3.8, Mg 1.8, Glucose 129, SCr 1.01.  Patient was started on IV Heparin and transferred to [facility] for further evaluation and treatment including cardiac catheterization. Once at [facility], patient reported that his chest no longer hurt but still reported some vague mild discomfort.  Patient denies any history of tobacco use and reports only drinking beer occasionally.  Per review of old notes: Cardiac cath in 2009 showed 3 vessel CAD to include mid occlusion of the LAD collateralized from the RCA via septal perforators. There was mild anterior wall hypokinesis, EF 59%. It was decided to trial medical therapy for control of anginal symptoms and secondary prevention with further consideration for attempted PCI of LAD CTO or referral for CABG should the patient remain symptomatic on medical therapy.   Hospital Course     Consultants: None.  NSTEMI Mr. Vignola was admitted with a NSTEMI as stated above. Troponin peaked at 8.37. Echo showed LVEF of 55-60%. Patient underwent left heart catheterization on 01/27/2019 which showed multivessel CAD including chronic total occlusion of mid LAD with relatively well-formed collaterals from PDA, total occlusion of proximal to mid circumflex (appeared to be a more recent occlusion) with faint collaterals, hyperdominant RCA with irregularities noted throughout the proximal, mid, and distal vessel. See full report above. Medical therapy was recommended. If patient continue to have angina, may need mechanical revascularization with CABG as a means of more total revascularization vs multi-site PCI (but this is less appealing). Patient tolerated the procedure well. Cath site with is soft with no signs of hematoma. Renal function stable. Able to ambulate to the bathroom without any difficulty. Will have Cardiac Rehab see patient prior to discharge. - Continue Aspirin , Lopressor  twice daily, and Atorvastatin  daily.  - Continue Imdur  daily.  - Will order outpatient Myoview in 6-8 weeks to assess  ischemic burden.  - If patient fails medical treatment, he will need CABG. - Will give patient a work note for 1 week.  Paroxysmal Atrial Fibrillation Patient initially found to be in atrial fibrillation with RVR in the ED but spontaneously converted back to sinus rhythm there and has maintained sinus rhythm since then. - Continue Lopressor  twice daily. - CHA2DS2-VASc = 3 (HTN, CAD, age). Continue Eliquis  twice daily.  Hypertension Systolic BP initially as high as the 170's. Patient has a history of hypertension but has been untreated as he had not seen a medical provider in many years prior to this admission. Lisinopril and Lopressor were started this admission. - Will increase Lisinopril to  daily. - Continue Lopressor  twice daily.  Hyperlipidemia Lipid panel this admission: Cholesterol 162, Triglycerides 120, HDL 52, LDL 86. LDL goal <70 given CAD. - Continue Lipitor  daily (started this admission). - Will need repeat lipid panel and CMP in 6 weeks.   Patient seen and examined by Dr. Eden Emms today and determined to be stable for discharge. Outpatient follow-up has been arranged. Medications as below. _____________  Discharge Vitals Blood pressure (!) 152/82, pulse 74, temperature 98 F (36.7 C), temperature source Oral, resp. rate 20, height 6' (1.829 m), weight 122.2 kg, SpO2 96 %.  Filed Weights   01/26/19 0427 01/27/19 0619 01/28/19 0647  Weight: 122.8 kg 122.6 kg 122.2 kg    Labs &  Radiologic Studies    CBC Recent Labs    01/27/19 0444 01/28/19 0605  WBC 7.2 7.4  HGB 14.0 13.7  HCT 41.2 39.9  MCV 87.1 86.6  PLT 162 158   Basic Metabolic Panel Recent Labs    16/10/96 0444 01/28/19 0605  NA 136 135  K 4.4 4.6  CL 103 103  CO2 24 22  GLUCOSE 101* 105*  BUN 12 12  CREATININE 1.07 1.05  CALCIUM 8.6* 8.8*   Liver Function Tests No results for input(s): AST, ALT, ALKPHOS, BILITOT, PROT, ALBUMIN in the last 72 hours. No results for  input(s): LIPASE, AMYLASE in the last 72 hours. Cardiac Enzymes No results for input(s): CKTOTAL, CKMB, CKMBINDEX, TROPONINI in the last 72 hours. BNP Invalid input(s): POCBNP D-Dimer No results for input(s): DDIMER in the last 72 hours. Hemoglobin A1C No results for input(s): HGBA1C in the last 72 hours. Fasting Lipid Panel No results for input(s): CHOL, HDL, LDLCALC, TRIG, CHOLHDL, LDLDIRECT in the last 72 hours. Thyroid Function Tests No results for input(s): TSH, T4TOTAL, T3FREE, THYROIDAB in the last 72 hours.  Invalid input(s): FREET3 _____________  Dg Chest Portable 1 View  Result Date: 01/23/2019 CLINICAL DATA:  Chest pain EXAM: PORTABLE CHEST 1 VIEW COMPARISON:  None. FINDINGS: Heart size and vascularity normal. Negative for heart failure. Mild atelectasis in the bases due to hypoventilation. Negative for pleural effusion. IMPRESSION: Hypoventilation with mild bibasilar atelectasis. Electronically Signed   By: Marlan Palau M.D.   On: 01/23/2019 16:26   Disposition   Patient is being discharged home today in good condition.  Follow-up Plans & Appointments    Follow-up Information    Laurann Montana, PA-C Follow up.   Specialties:  Cardiology, Radiology Why:  You have a virtual follow-up visit scheduled for 02/10/2019 at 9:30am with Ronie Spies, one of Dr. Lindaann Slough PAs. Our office will also call you to schedule the outpatient stress test.  Contact information: [address] [place] [address] [phone]          Discharge Instructions    Amb Referral to Cardiac Rehabilitation   Complete by:  As directed    Diagnosis:  NSTEMI   After initial evaluation and assessments completed: Virtual Based Care may be provided alone or in conjunction with Phase 2 Cardiac Rehab based on patient barriers.:  Yes   Diet - low sodium heart healthy   Complete by:  As directed    Increase activity slowly   Complete by:  As directed       Discharge  Medications   Allergies as of 01/28/2019   No Known Allergies     Medication List    STOP taking these medications   betamethasone dipropionate 0.05 % cream Commonly known as:  DIPROLENE     TAKE these medications   apixaban 5 MG Tabs tablet Commonly known as:  ELIQUIS Take 1 tablet (5 mg total) by mouth 2 (two) times daily.   aspirin 81 MG chewable tablet Chew 1 tablet (81 mg total) by mouth daily. Start taking on:  Jan 29, 2019   atorvastatin 80 MG tablet Commonly known as:  LIPITOR Take 1 tablet (80 mg total) by mouth daily at 6 PM.   isosorbide mononitrate 30 MG 24 hr tablet Commonly known as:  IMDUR Take 1 tablet (30 mg total) by mouth daily. Start taking on:  Jan 29, 2019   lisinopril 10 MG tablet Commonly known as:  ZESTRIL Take 1 tablet (10  mg total) by mouth daily. Start taking on:  Jan 29, 2019   metoprolol tartrate 50 MG tablet Commonly known as:  LOPRESSOR Take 1 tablet (50 mg total) by mouth 2 (two) times daily.   nitroGLYCERIN 0.4 MG SL tablet Commonly known as:  NITROSTAT Place 1 tablet (0.4 mg total) under the tongue every 5 (five) minutes as needed for chest pain.        Acute coronary syndrome (MI, NSTEMI, STEMI, etc) this admission?: Yes.     AHA/ACC Clinical Performance & Quality Measures: 1. Aspirin prescribed? - Yes 2. ADP Receptor Inhibitor (Plavix/Clopidogrel, Brilinta/Ticagrelor or Effient/Prasugrel) prescribed (includes medically managed patients)? - No - No interventions were done. Treating medically. 3. Beta Blocker prescribed? - Yes 4. High Intensity Statin (Lipitor 40-80mg  or Crestor 20-40mg ) prescribed? - Yes 5. EF assessed during THIS hospitalization? - Yes 6. For EF <40%, was ACEI/ARB prescribed? - Yes 7. For EF <40%, Aldosterone Antagonist (Spironolactone or Eplerenone) prescribed? - Not Applicable (EF >/= 40%) 8. Cardiac Rehab Phase II ordered (Included Medically managed Patients)? - Yes     Outstanding Labs/Studies    Repeat lipid panel and CMP in 6 weeks after starting high-intensity statin.  Duration of Discharge Encounter   Greater than 30 minutes including physician time.  Signed, Corrin Parkerallie E Juan Kissoon, PA-C 01/28/2019, 12:35 PM

## 2019-01-28 NOTE — Progress Notes (Signed)
CARDIAC REHAB PHASE I   PRE:  Rate/Rhythm: 65 SR    BP: sitting 116/72    SaO2:   MODE:  Ambulation: 470 ft   POST:  Rate/Rhythm: 74 SR    BP: sitting 122/66     SaO2:   Pt c/o slight chest pressure lying in bed. He became irritated when I asked him specific questions about this sensation.  He ambulated in hall and sts chest pressure resolved and did not return, even when he returned to bed. VSS. Discussed MI, restrictions, diet, exercising at home, NTG, meds, and CRPII. Pt flat, seems frustrated by situation. Will refer to [place] CRPII.  [id number]  Harriet Masson CES, ACSM 01/28/2019 11:20 AM

## 2019-01-28 NOTE — TOC Benefit Eligibility Note (Signed)
Transition of Care [facility]) Benefit Eligibility Note    Patient Details  Name: Gary Romero MRN: [id number] Date of Birth: 04/26/1951   Medication/Dose: Everlene Balls  5 MG BID  Covered?: Yes     Prescription Coverage Preferred Pharmacy: YES([facility], 90 DAY SUPPLY FOR M/O  $ 52.00)  Spoke with Person/Company/Phone Number:: JESSICA([facility] [employer] [facility] # [phone])  Co-Pay: $ 52.00  Prior Approval: No          Mardene Sayer Phone Number: 01/28/2019, 11:05 AM

## 2019-02-02 ENCOUNTER — Encounter: Payer: Self-pay | Admitting: Student

## 2019-02-04 ENCOUNTER — Telehealth (HOSPITAL_COMMUNITY): Payer: Self-pay

## 2019-02-04 NOTE — Telephone Encounter (Signed)
Spoke with the patient, and instructions given. Asked to call back with any questions. He stated that he would be here. S.Darrah Dredge EMTP

## 2019-02-09 ENCOUNTER — Ambulatory Visit (HOSPITAL_COMMUNITY): Payer: 59 | Attending: Cardiovascular Disease

## 2019-02-09 DIAGNOSIS — I25119 Atherosclerotic heart disease of native coronary artery with unspecified angina pectoris: Secondary | ICD-10-CM

## 2019-02-09 LAB — MYOCARDIAL PERFUSION IMAGING
LV dias vol: 122 mL (ref 62–150)
LV sys vol: 61 mL
Peak HR: 68 {beats}/min
Rest HR: 57 {beats}/min
SDS: 3
SRS: 9
SSS: 13
TID: 1.02

## 2019-02-09 MED ORDER — REGADENOSON 0.4 MG/5ML IV SOLN
0.4000 mg | Freq: Once | INTRAVENOUS | Status: AC
  Administered 2019-02-09: 0.4 mg via INTRAVENOUS

## 2019-02-09 MED ORDER — TECHNETIUM TC 99M TETROFOSMIN IV KIT
31.9000 | PACK | Freq: Once | INTRAVENOUS | Status: AC | PRN
  Administered 2019-02-09: 31.9 via INTRAVENOUS
  Filled 2019-02-09: qty 32

## 2019-02-09 MED ORDER — TECHNETIUM TC 99M TETROFOSMIN IV KIT
10.0000 | PACK | Freq: Once | INTRAVENOUS | Status: AC | PRN
  Administered 2019-02-09: 10 via INTRAVENOUS
  Filled 2019-02-09: qty 10

## 2019-02-09 NOTE — Progress Notes (Addendum)
Virtual Visit via Video Note   This visit type was conducted due to national recommendations for restrictions regarding the COVID-19 Pandemic (e.g. social distancing) in an effort to limit this patient's exposure and mitigate transmission in our community.  Due to his co-morbid illnesses, this patient is at least at moderate risk for complications without adequate follow up.  This format is felt to be most appropriate for this patient at this time.  All issues noted in this document were discussed and addressed.  A limited physical exam was performed with this format.  Please refer to the patient's chart for his consent to telehealth for [facility].   Date:  02/10/2019   ID:  Gary Romero, DOB 06/13/1951, MRN [id number]  Patient Location: Home Provider Location: Home  PCP:  Patient, No Pcp Per  Cardiologist:  Tobias AlexanderKatarina Nelson, MD  Electrophysiologist:  None   Evaluation Performed:  Follow-Up Visit  Chief Complaint:  F/u NSTEMI  History of Present Illness:    Gary Romero is a 68 y.o. male with multivessel CAD/MI 2009 treated medically, HLD, HTN, recently diagnosed atrial fib who presents for hospital follow-up. 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 He has a history of MI and multivessel CAD by catheterozation in 2009 which was treated medically. He says that he moved out of town shortly after 2009 and has not had any cardiac or primary care since then. He was admitted 5/22-5/27/20 with indigestion and shortness of breath. He was found to be in atrial fib with but spontaneously converted to NSR. His troponin peaked at 8.37 and WBC was up. He underwent cath showing multivessel CAD including chronic total occlusion of mid LAD with relatively well-formed collaterals from PDA, total occlusion of proximal to mid circumflex (appeared to be a more recent occlusion) with faint collaterals, hyperdominant  RCA with irregularities noted throughout the proximal, mid, and distal vessel. See full report above. Medical therapy was recommended. If patient continues to have angina, he may need mechanical revascularization with CABG as a means of more total revascularization vs multi-site PCI (but this is less appealing). 2D echo 01/24/19 showed EF 55-60%, diastolic dysfunction, normal RV, trivial pericardial effusion. He was started on Eliquis for his PAF as well as lisinopril, metoprolol, atorvastatin, Imdur and aspirin. Last labs showed K 4.6, Cr 1.05, normal CBC, LDL 86, A1C 5.2, negative Covid. Nuclear stress test was recommended as outpatient to assess ischemic burden and resulted today, showing "Low risk stress nuclear study with a medium-sized severe lateral perfusion defect (mostly scar with limited peri-infarct ischemia). Mildly reduced global left ventricular systolic function EF 50%."  He is seen virtually for today's visit. It was challenging visit as he appears frustrated when questioned to clarify how he is feeling. Per Chart Review it appears inpatient cardiac rehab had a similar interaction. I tried to reassure him we are on his team here to help him, not to bother him, and that he knows his body better than anyone so we are just trying to find out how we can help. He denies any chest pain/indigestion, swelling or heart fluttering. He reports overall feeling "OK." He reports that he's had some shortness of breath with exertion over the weeks following his hospitalization. He just can't do what he used to be able to do. This is the symptom that I have great difficulty clarifying. He mentioned it as shortness of  breath the first time but upon further questioning, he then said "y'all just keep getting it wrong. Maybe I'm saying it wrong. I just don't have the endurance. I don't breathe heavy or get short winded or anything." He feels this is because he has very sedentary the last 14 days in his recovery,  whereas he's used to being physically active. He gradually plans to increase his activity level. He has no way of having his blood pressure or heart rate checked, although at his stress test yesterday his resting BP was 98/64 (HR 57) and EKG showed sinus rhythm. We discussed getting a cuff which he agreed to consider but with reluctance in his voice. The patient does not have symptoms concerning for COVID-19 infection (fever, chills, cough).    Past Medical History:  Diagnosis Date   CAD (coronary artery disease)    a. 2009: multivessel CAD by cath - medically treated; b. 01/27/2019: CTO of mid LAD with collaterals; total occlusion of prox to mid CX wih faint collaterals; hyperdominant RCA with irregularities throughout prox, mid, distal vessel - medically treated   Heart attack ([place])    Hyperlipidemia    Hypertension    Paroxysmal atrial fibrillation ([place]) 01/2019   Past Surgical History:  Procedure Laterality Date   LEFT HEART CATH AND CORONARY ANGIOGRAPHY N/A 01/27/2019   Procedure: LEFT HEART CATH AND CORONARY ANGIOGRAPHY;  Surgeon: Belva Crome, MD;  Location: [facility];  Service: Cardiovascular;  Laterality: N/A;   PELVIC FRACTURE SURGERY       Current Meds  Medication Sig   apixaban (ELIQUIS) 5 MG TABS tablet Take 1 tablet (5 mg total) by mouth 2 (two) times daily.   aspirin 81 MG chewable tablet Chew 1 tablet (81 mg total) by mouth daily.   atorvastatin (LIPITOR) 80 MG tablet Take 1 tablet (80 mg total) by mouth daily at 6 PM.   isosorbide mononitrate (IMDUR) 30 MG 24 hr tablet Take 1 tablet (30 mg total) by mouth daily.   lisinopril (ZESTRIL) 10 MG tablet Take 1 tablet (10 mg total) by mouth daily.   metoprolol tartrate (LOPRESSOR) 50 MG tablet Take 1 tablet (50 mg total) by mouth 2 (two) times daily.   nitroGLYCERIN (NITROSTAT) 0.4 MG SL tablet Place 1 tablet (0.4 mg total) under the tongue every 5 (five) minutes as needed for chest pain.     Allergies:    Patient has no known allergies.   Social History   Tobacco Use   Smoking status: Never Smoker   Smokeless tobacco: Never Used  Substance Use Topics   Alcohol use: Yes    Comment: occ   Drug use: Never     Family Hx: The patient's family history includes Breast cancer in his mother; CAD in his father.  ROS:   Please see the history of present illness.    All other systems reviewed and are negative.   Prior CV studies:    Most recent pertinent cardiac studies are outlined above.  Labs/Other Tests and Data Reviewed:    EKG:  An ECG dated 01/25/19 was personally reviewed today and demonstrated:  NSR 69bpm, NSST changes, QTc 460ms per report  Recent Labs: 01/23/2019: ALT 35; B Natriuretic Peptide 226.3; Magnesium 1.8 01/28/2019: BUN 12; Creatinine, Ser 1.05; Hemoglobin 13.7; Platelets 158; Potassium 4.6; Sodium 135   Recent Lipid Panel Lab Results  Component Value Date/Time   CHOL 162 01/24/2019 07:21 AM   TRIG 120 01/24/2019 07:21 AM   HDL 52  01/24/2019 07:21 AM   CHOLHDL 3.1 01/24/2019 07:21 AM   LDLCALC 86 01/24/2019 07:21 AM    Wt Readings from Last 3 Encounters:  02/10/19 268 lb (121.6 kg)  02/09/19 269 lb (122 kg)  01/28/19 269 lb 6.4 oz (122.2 kg)     Objective:    Vital Signs:  Ht 6' (1.829 m)    Wt 268 lb (121.6 kg)    BMI 36.35 kg/m    VS reviewed. General - WM in no acute distress HEENT - NCAT, EOM intact Pulm - No labored breathing, no coughing during visit, no audible wheezing, speaking in full sentences Neuro - A+Ox3, no slurred speech, answers questions appropriately Psych - irritated affect with abrasive tone at times, other times calm   ASSESSMENT & PLAN:    1. CAD - challenging to get a sense for how Gary Romero is actually doing. He seems frustrated but very guarded. He feels his endurance is down. He goes on to deny any actual breathing difficulty, but he did use the term shortness of breath earlier in the interaction. He has not had  any recurrence of chest discomfort or fluttering. His vitals and EKG were reviewed at stress test yesterday. His BP was somewhat soft. I have recommended he stop his lisinopril and increase his Imdur to 2 tablets (60mg  daily) to see if it helps with his endurance. I did not yet send in consolidated rx as this may need to be uptitrated even further depending on how he feels in follow-up. At this point I think he'd benefit from close in-office visit for f/u, vitals and EKG. He has no way of checking his BP and pulse and seemed somewhat uninvested when suggested to get a cuff. He would prefer time to gradually increase his activity over the next few weeks. Will arrange for in-office visit on 6/19 (specifically requests a Friday PM for his schedule as he works in [place]). This was made for the [place] office due to the time slot needed since [facility] is only in the office AM that day. Instructions given on arriving 15 minutes early and directions to the [place] office. He will notify us if symptoms persist or worsen at which time follow-up will need to be moved up. 2. PAF - he denies any fluttering but has no way to check his HR. Would suggest formal repeat EKG when he comes to the office. Would also recommend CBC/BMET at that time as his 1 month f/u to initiation of Eliquis. I will not order these today in case there are other labs that are deemed necessary at that time. 3. Essential HTN - discussed obtaining cuff and calling us with those readings. Will need re-assessment in the office with med changes. 4. Hyperlipidemia - when he comes in for OV, would recommend arranging LFTs/fasting lipids for sometime in late July to recheck.   COVID-19 Education: The signs and symptoms of COVID-19 were discussed with the patient and how to seek care for testing (follow up with PCP or arrange E-visit).  The importance of social distancing was discussed today.  Time:   Today, I have spent 19 minutes with the patient with  telehealth technology discussing the above problems.     Medication Adjustments/Labs and Tests Ordered: Current medicines are reviewed at length with the patient today.  Concerns regarding medicines are outlined above.   Disposition:  Follow up 6/19 as outlined above.  Signed, Laurann Montanaayna N Tannis Burstein, PA-C  02/10/2019 3:21 PM  [facility]

## 2019-02-10 ENCOUNTER — Other Ambulatory Visit: Payer: Self-pay

## 2019-02-10 ENCOUNTER — Telehealth: Payer: Self-pay

## 2019-02-10 ENCOUNTER — Encounter: Payer: Self-pay | Admitting: Physician Assistant

## 2019-02-10 ENCOUNTER — Telehealth (INDEPENDENT_AMBULATORY_CARE_PROVIDER_SITE_OTHER): Payer: PRIVATE HEALTH INSURANCE | Admitting: Physician Assistant

## 2019-02-10 VITALS — Ht 72.0 in | Wt 268.0 lb

## 2019-02-10 DIAGNOSIS — I2510 Atherosclerotic heart disease of native coronary artery without angina pectoris: Secondary | ICD-10-CM

## 2019-02-10 DIAGNOSIS — E785 Hyperlipidemia, unspecified: Secondary | ICD-10-CM | POA: Diagnosis not present

## 2019-02-10 DIAGNOSIS — I10 Essential (primary) hypertension: Secondary | ICD-10-CM

## 2019-02-10 DIAGNOSIS — I480 Paroxysmal atrial fibrillation: Secondary | ICD-10-CM

## 2019-02-10 MED ORDER — ISOSORBIDE MONONITRATE ER 30 MG PO TB24: 60.0000 mg | ORAL_TABLET | Freq: Every day | ORAL | Status: DC

## 2019-02-10 NOTE — Addendum Note (Signed)
Addended by: Charlie Pitter on: 02/10/2019 05:01 PM   Modules accepted: Orders

## 2019-02-10 NOTE — Patient Instructions (Addendum)
Medication Instructions:   Please stop your lisinopril.  Please increase your isosorbide medicine to 2 tablets (60mg  total) by mouth daily. You can use up the tablets you have at home for now.   If you need a refill on your cardiac medications before your next appointment, please call your pharmacy.   Lab work: None ordered  If you have labs (blood work) drawn today and your tests are completely normal, you will receive your results only by: Marland Kitchen MyChart Message (if you have MyChart) OR . A paper copy in the mail If you have any lab test that is abnormal or we need to change your treatment, we will call you to review the results.  Testing/Procedures: None ordered  Follow-Up: At [facility], you and your health needs are our priority.  As part of our continuing mission to provide you with exceptional heart care, we have created designated Provider Care Teams.  These Care Teams include your primary Cardiologist (physician) and Advanced Practice Providers (APPs -  Physician Assistants and Nurse Practitioners) who all work together to provide you with the care you need, when you need it. YOU HAVE BEEN SCHEDULED TO SEE ANGIE DUKE, PA-C, IN OFFICE, 02/20/2019 AT 3:15.  PLEASE ARRIVE 15 MINUTES EARLY TO ALLOW FOR PRESCREENING FOR COVID  Any Other Special Instructions Will Be Listed Below (If Applicable).  Please get a blood pressure cuff that goes on your arm. The wrist ones can be inaccurate. If possible, try to select one that also reports your heart rate. To check your blood pressure, choose a time about 3 hours after taking your blood pressure medicines. Remain seated in a chair for 5 minutes quietly beforehand, then check it. When you get a cuff, please get those readings and call us/send in MyChart message with them for our review.

## 2019-02-10 NOTE — Telephone Encounter (Signed)
Spoke to pt and he gave me verbal consent to do video visit. 02/10/19

## 2019-02-12 NOTE — Progress Notes (Signed)
Addendum 12:46 PM 02/12/2019 Dr. Meda Coffee reviewed chart/nuc and agrees with plan.

## 2019-02-19 NOTE — Progress Notes (Signed)
Cardiology Office Note:    Date:  02/20/2019   ID:  Gary Sorrowonnie O Ruz, DOB 1951-07-12, MRN [id number]  PCP:  Patient, No Pcp Per  Cardiologist:  Tobias AlexanderKatarina Nelson, MD   Referring MD: No ref. provider found   Chief Complaint  Patient presents with  . Follow-up  CAD  History of Present Illness:    Gary Romero is a 68 y.o. male with a hx of CAD with MI 2009 treated medically, atrial fibrillation, hypertension, and hyperlipidemia.  He was recently diagnosed with atrial fibrillation and anticoagulated with eliquis. During his admission 01/23/19-01/28/19, he presented with indigestion and SOB, found to be in Afib and spontaneously converted. Troponin peaked at 8.37. He underwent heart catheterization which revealed total occlusion of midLAD with collaterals and total occlusion of prox-mid Cx with faint collaterals (thought to be more recent). No intervention. If he continues to have angina, may need to be referred for evaluation for CABG vs PCI. Echo with preserved EF of 55-60% but diastolic dysfunction. He was discharged on eliquis for PAF, lisinopril, lopressor, imdur, ASA and statin. Nuclear stress test obtained outpatient to assess ischemic burden was read as low risk wit medium-sized severe lateral perfusion defect (mostly scar with limited peri-infarct ischemia).  He was seen in follow up with Gary Romero, PAC during a virtual visit. At that time, the patient did admit to DOE that started prior to his hospitalization. However, upon further questioning, the patient's symptoms were difficult to accurately describe. D. Romero decided to bring him back for close in-office follow up for EKG, BP check, and another look at his symptoms. In the meantime, his imdur was doubled and ACEI dropped.   He presented back today for an office follow-up.  EKG without acute ischemic changes.  Again, it is difficult to ascertain his symptoms.  He states he walks about 10 miles a day at his job.  However he also states  that he is not exerting himself.  It is difficult to figure out what his baseline is and what his current functional capacity is.  He does state that he feels better on the increased dose of Imdur.  We discussed medical therapy versus revascularization with CABG versus CTO.  He states that if he is going to have a procedure he wants to do it while he still has insurance.  Of note, Myoview was low risk with no reversible ischemia.  Case was discussed with Dr. SwazilandJordan, DOD.  Past Medical History:  Diagnosis Date  . CAD (coronary artery disease)    a. 2009: multivessel CAD by cath - medically treated; b. 01/27/2019: CTO of mid LAD with collaterals; total occlusion of prox to mid CX wih faint collaterals; hyperdominant RCA with irregularities throughout prox, mid, distal vessel - medically treated  . Heart attack (HCC)   . Hyperlipidemia   . Hypertension   . Paroxysmal atrial fibrillation (HCC) 01/2019    Past Surgical History:  Procedure Laterality Date  . LEFT HEART CATH AND CORONARY ANGIOGRAPHY N/A 01/27/2019   Procedure: LEFT HEART CATH AND CORONARY ANGIOGRAPHY;  Surgeon: Lyn RecordsSmith, Henry W, MD;  Location: [facility] INVASIVE [facility];  Service: Cardiovascular;  Laterality: N/A;  . PELVIC FRACTURE SURGERY      Current Medications: Current Meds  Medication Sig  . apixaban (ELIQUIS) 5 MG TABS tablet Take 1 tablet (5 mg total) by mouth 2 (two) times daily.  Marland Kitchen. aspirin 81 MG chewable tablet Chew 1 tablet (81 mg total) by mouth daily.  Marland Kitchen. atorvastatin (LIPITOR)  80 MG tablet Take 1 tablet (80 mg total) by mouth daily at 6 PM.  . isosorbide mononitrate (IMDUR) 60 MG 24 hr tablet Take 1 tablet (60 mg total) by mouth daily.  Marland Kitchen. lisinopril (ZESTRIL) 10 MG tablet Take 1 tablet (10 mg total) by mouth daily.  . metoprolol tartrate (LOPRESSOR) 50 MG tablet Take 1 tablet (50 mg total) by mouth 2 (two) times daily.  . nitroGLYCERIN (NITROSTAT) 0.4 MG SL tablet Place 1 tablet (0.4 mg total) under the tongue every 5 (five)  minutes as needed for chest pain.  . [DISCONTINUED] apixaban (ELIQUIS) 5 MG TABS tablet Take 1 tablet (5 mg total) by mouth 2 (two) times daily.  . [DISCONTINUED] atorvastatin (LIPITOR) 80 MG tablet Take 1 tablet (80 mg total) by mouth daily at 6 PM.  . [DISCONTINUED] isosorbide mononitrate (IMDUR) 30 MG 24 hr tablet Take 2 tablets (60 mg total) by mouth daily.  . [DISCONTINUED] lisinopril (ZESTRIL) 10 MG tablet Take 1 tablet (10 mg total) by mouth daily.  . [DISCONTINUED] metoprolol tartrate (LOPRESSOR) 50 MG tablet Take 1 tablet (50 mg total) by mouth 2 (two) times daily.  . [DISCONTINUED] nitroGLYCERIN (NITROSTAT) 0.4 MG SL tablet Place 1 tablet (0.4 mg total) under the tongue every 5 (five) minutes as needed for chest pain.     Allergies:   Patient has no known allergies.   Social History   Socioeconomic History  . Marital status: Single    Spouse name: Not on file  . Number of children: Not on file  . Years of education: Not on file  . Highest education level: Not on file  Occupational History  . Not on file  Social Needs  . Financial resource strain: Not on file  . Food insecurity    Worry: Not on file    Inability: Not on file  . Transportation needs    Medical: Not on file    Non-medical: Not on file  Tobacco Use  . Smoking status: Never Smoker  . Smokeless tobacco: Never Used  Substance and Sexual Activity  . Alcohol use: Yes    Comment: occ  . Drug use: Never  . Sexual activity: Not on file  Lifestyle  . Physical activity    Days per week: Not on file    Minutes per session: Not on file  . Stress: Not on file  Relationships  . Social Musicianconnections    Talks on phone: Not on file    Gets together: Not on file    Attends religious service: Not on file    Active member of club or organization: Not on file    Attends meetings of clubs or organizations: Not on file    Relationship status: Not on file  Other Topics Concern  . Not on file  Social History Narrative   . Not on file     Family History: The patient's family history includes Breast cancer in his mother; CAD in his father.  ROS:   Please see the history of present illness.     All other systems reviewed and are negative.  EKGs/Labs/Other Studies Reviewed:    The following studies were reviewed today:  Left heart cath 01/27/19  Total occlusion mid LAD beyond the first diagonal (chronic total).  Relatively well-formed collaterals from PDA.  The first diagonal is relatively large.  Total occlusion proximal to mid circumflex (appears to be a more recent occlusion).  Very faint collaterals are noted.  Left main is widely patent.  Widely patent ramus intermedius.  Hyperdominant RCA with irregularities noted throughout the proximal mid and distal vessel.  PDA ostium contains 60% eccentric narrowing, proximal P DA contains 50% narrowing, mid PDA contains 50% narrowing.  PDA supplies collaterals around the apex to the LAD.  Continuation of the RCA before 2 marginal branches contain 70% narrowing.  Overall preserved LV function with EF 55%.  Anterior wall apex and inferoapical region revealed normal contractility.  Inferobasal region is now well identified.  EDP 19 mmHg.  RECOMMENDATIONS:   Medical therapy for underlying coronary disease.  If anginal complaints despite medical therapy, consider mechanical revascularization with CABG as a means of more total revascularization versus multisite PCI on the right coronary (less appealing).  Begin apixaban 12 hours post cath.  In interim we will restart IV heparin until apixaban can be given.  Once apixaban started, heparin will be discontinued.   Myoview 02/09/19:  Nuclear stress EF: 50%.  There was no ST segment deviation noted during stress.  No T wave inversion was noted during stress.  Defect 1: There is a medium defect of severe severity present in the basal inferolateral, basal anterolateral and mid anterolateral location.   Findings consistent with prior myocardial infarction with peri-infarct ischemia.  This is a low risk study.  The left ventricular ejection fraction is mildly decreased (45-54%).   Low risk stress nuclear study with a medium-sized severe lateral perfusion defect (mostly scar with limited peri-infarct ischemia). Mildly reduced global eft ventricular systolic function.  EKG:  EKG is ordered today.  The ekg ordered today demonstrates sinus rhythm, HR 60, no acute ischemic changes  Recent Labs: 01/23/2019: ALT 35; B Natriuretic Peptide 226.3; Magnesium 1.8 01/28/2019: BUN 12; Creatinine, Ser 1.05; Hemoglobin 13.7; Platelets 158; Potassium 4.6; Sodium 135  Recent Lipid Panel    Component Value Date/Time   CHOL 162 01/24/2019 0721   TRIG 120 01/24/2019 0721   HDL 52 01/24/2019 0721   CHOLHDL 3.1 01/24/2019 0721   VLDL 24 01/24/2019 0721   LDLCALC 86 01/24/2019 0721    Physical Exam:    VS:  BP 106/67   Pulse 62   Ht 6' (1.829 m)   Wt 273 lb (123.8 kg)   BMI 37.03 kg/m     Wt Readings from Last 3 Encounters:  02/20/19 273 lb (123.8 kg)  02/10/19 268 lb (121.6 kg)  02/09/19 269 lb (122 kg)     GEN: Well nourished, well developed in no acute distress HEENT: Normal NECK: No JVD; No carotid bruits LYMPHATICS: No lymphadenopathy CARDIAC: RRR, no murmurs, rubs, gallops RESPIRATORY:  Clear to auscultation without rales, wheezing or rhonchi  ABDOMEN: Soft, non-tender, non-distended MUSCULOSKELETAL:  No edema; No deformity  SKIN: Warm and dry NEUROLOGIC:  Alert and oriented x 3 PSYCHIATRIC:  Flattened affect   ASSESSMENT:    1. CAD in native artery   2. PAF (paroxysmal atrial fibrillation) ([place])   3. Essential hypertension   4. Hyperlipidemia, unspecified hyperlipidemia type    PLAN:    In order of problems listed above:  CAD in native artery  It is difficult to ascertain his anginal symptoms at this time.  He does state that he feels better on the increased dose of  Imdur.  We will continue Imdur 60 mg daily.  Blood pressure well controlled today no medication changes.  We discussed his coronary artery disease, including treatment with medical therapy versus revascularization with CABG versus CTO.  Initially attempted to schedule a close follow-up with Dr.  Delton SeeNelson.  However case was discussed with Dr. Swazil[place]Jordan, DOD.  He reviewed cath films and suggested that he likely would not be a CABG candidate due to poor targets.  He would also not be a candidate for CTO.  Dr. SwazilandJordan, therefore, recommends continued medical therapy.  PAF (paroxysmal atrial fibrillation) (HCC)  Continue anticoagulation with Eliquis.  No bleeding problems.  He denies palpitations.  Essential hypertension  Pressure well controlled.  Hyperlipidemia, unspecified hyperlipidemia type  01/24/2019: Cholesterol 162; HDL 52; LDL Cholesterol 86; Triglycerides 120; VLDL 24 Lipitor 80 mg started 01/28/19. Will need lipids checked in 6 weeks with LFTs.    Needs follow up with Dr. Delton SeeNelson in 3 months. Fasting labs in 6 weeks.   Medication Adjustments/Labs and Tests Ordered: Current medicines are reviewed at length with the patient today.  Concerns regarding medicines are outlined above.  Orders Placed This Encounter  Procedures  . Lipid Profile  . Hepatic function panel   Meds ordered this encounter  Medications  . isosorbide mononitrate (IMDUR) 60 MG 24 hr tablet    Sig: Take 1 tablet (60 mg total) by mouth daily.    Dispense:  90 tablet    Refill:  3  . apixaban (ELIQUIS) 5 MG TABS tablet    Sig: Take 1 tablet (5 mg total) by mouth 2 (two) times daily.    Dispense:  180 tablet    Refill:  3  . atorvastatin (LIPITOR) 80 MG tablet    Sig: Take 1 tablet (80 mg total) by mouth daily at 6 PM.    Dispense:  90 tablet    Refill:  3  . lisinopril (ZESTRIL) 10 MG tablet    Sig: Take 1 tablet (10 mg total) by mouth daily.    Dispense:  90 tablet    Refill:  3  . metoprolol tartrate (LOPRESSOR)  50 MG tablet    Sig: Take 1 tablet (50 mg total) by mouth 2 (two) times daily.    Dispense:  180 tablet    Refill:  3  . nitroGLYCERIN (NITROSTAT) 0.4 MG SL tablet    Sig: Place 1 tablet (0.4 mg total) under the tongue every 5 (five) minutes as needed for chest pain.    Dispense:  25 tablet    Refill:  4    Signed, Marcelino Dusterngela Nicole Darianna Amy, [place]  02/20/2019 5:08 PM    [facility]

## 2019-02-20 ENCOUNTER — Ambulatory Visit (INDEPENDENT_AMBULATORY_CARE_PROVIDER_SITE_OTHER): Payer: PRIVATE HEALTH INSURANCE | Admitting: Physician Assistant

## 2019-02-20 ENCOUNTER — Encounter: Payer: Self-pay | Admitting: Physician Assistant

## 2019-02-20 ENCOUNTER — Other Ambulatory Visit: Payer: Self-pay

## 2019-02-20 VITALS — BP 106/67 | HR 62 | Ht 72.0 in | Wt 273.0 lb

## 2019-02-20 DIAGNOSIS — E785 Hyperlipidemia, unspecified: Secondary | ICD-10-CM | POA: Diagnosis not present

## 2019-02-20 DIAGNOSIS — I480 Paroxysmal atrial fibrillation: Secondary | ICD-10-CM

## 2019-02-20 DIAGNOSIS — I10 Essential (primary) hypertension: Secondary | ICD-10-CM | POA: Diagnosis not present

## 2019-02-20 DIAGNOSIS — I2510 Atherosclerotic heart disease of native coronary artery without angina pectoris: Secondary | ICD-10-CM

## 2019-02-20 MED ORDER — ISOSORBIDE MONONITRATE ER 60 MG PO TB24: 60.0000 mg | ORAL_TABLET | Freq: Every day | ORAL | 3 refills | Status: DC

## 2019-02-20 MED ORDER — LISINOPRIL 10 MG PO TABS: 10.0000 mg | ORAL_TABLET | Freq: Every day | ORAL | 3 refills | Status: DC

## 2019-02-20 MED ORDER — METOPROLOL TARTRATE 50 MG PO TABS: 50.0000 mg | ORAL_TABLET | Freq: Two times a day (BID) | ORAL | 3 refills | Status: DC

## 2019-02-20 MED ORDER — ATORVASTATIN CALCIUM 80 MG PO TABS: 80.0000 mg | ORAL_TABLET | Freq: Every day | ORAL | 3 refills | Status: DC

## 2019-02-20 MED ORDER — NITROGLYCERIN 0.4 MG SL SUBL: 0.4000 mg | SUBLINGUAL_TABLET | SUBLINGUAL | 4 refills | Status: DC | PRN

## 2019-02-20 MED ORDER — APIXABAN 5 MG PO TABS: 5.0000 mg | ORAL_TABLET | Freq: Two times a day (BID) | ORAL | 3 refills | Status: DC

## 2019-02-20 NOTE — Patient Instructions (Signed)
Medication Instructions:   Increase Isosorbide to 60 mg daily.  If you need a refill on your cardiac medications before your next appointment, please call your pharmacy.    Follow-Up: At [facility], you and your health needs are our priority.  As part of our continuing mission to provide you with exceptional heart care, we have created designated Provider Care Teams.  These Care Teams include your primary Cardiologist (physician) and Advanced Practice Providers (APPs -  Physician Assistants and Nurse Practitioners) who all work together to provide you with the care you need, when you need it. You will need a follow up virtual appointment with Ena Dawley, MD or one of the following Advanced Practice Providers on your designated Care Team:   Sasser, PA-C Melina Copa, PA-C . Ermalinda Barrios, PA-C  --We are contacting Dr. Meda Coffee to find out her next available virtual appointment.

## 2019-02-23 ENCOUNTER — Telehealth (HOSPITAL_COMMUNITY): Payer: Self-pay

## 2019-02-23 ENCOUNTER — Encounter (HOSPITAL_COMMUNITY): Payer: Self-pay

## 2019-02-23 ENCOUNTER — Other Ambulatory Visit (INDEPENDENT_AMBULATORY_CARE_PROVIDER_SITE_OTHER): Payer: PRIVATE HEALTH INSURANCE

## 2019-02-23 DIAGNOSIS — I10 Essential (primary) hypertension: Secondary | ICD-10-CM

## 2019-02-23 NOTE — Telephone Encounter (Signed)
Called and spoke with pt in regards to our Virtual Cardiac Rehab program, pt stated he would like to wait until our Cardiac Rehab program opens back up for in-gym exercise.

## 2019-02-23 NOTE — Progress Notes (Signed)
ekg 

## 2019-03-13 ENCOUNTER — Telehealth (HOSPITAL_COMMUNITY): Payer: Self-pay

## 2019-03-13 NOTE — Telephone Encounter (Signed)
Pt insurance is active and benefits verified through [place]. Co-pay $0.00, DED $1,500.00/$1,500.00 met, out of pocket $3,000.00/$3,000.00 met, co-insurance 10%. No pre-authorization required. Teresa/[employer], 03/13/2019 @ 11:29AM, DGW#[id number]

## 2019-10-24 ENCOUNTER — Ambulatory Visit: Payer: Medicare Other | Attending: Internal Medicine

## 2019-10-24 DIAGNOSIS — Z23 Encounter for immunization: Secondary | ICD-10-CM

## 2019-10-24 NOTE — Progress Notes (Signed)
   Covid-19 Vaccination Clinic  Name:  Gary Romero    MRN: [id number] DOB: 1951-04-01  10/24/2019  Mr. [name] was observed post Covid-19 immunization for 15 minutes without incidence. He was provided with Vaccine Information Sheet and instruction to access the V-Safe system.   Mr. [name] was instructed to call 911 with any severe reactions post vaccine: Marland Kitchen Difficulty breathing  . Swelling of your face and throat  . A fast heartbeat  . A bad rash all over your body  . Dizziness and weakness    Immunizations Administered    Name Date Dose VIS Date Route   Pfizer COVID-19 Vaccine 10/24/2019  9:11 AM 0.3 mL 08/14/2019 Intramuscular   Manufacturer: [employer], [employer]   Lot: [id number]   NDC: [address]-8

## 2019-11-17 ENCOUNTER — Ambulatory Visit: Payer: Medicare Other | Attending: Internal Medicine

## 2019-11-17 DIAGNOSIS — Z23 Encounter for immunization: Secondary | ICD-10-CM

## 2019-11-17 NOTE — Progress Notes (Signed)
   Covid-19 Vaccination Clinic  Name:  Gary Romero    MRN: [id number] DOB: Dec 09, 1950  11/17/2019  Mr. Gary Romero was observed post Covid-19 immunization for 15 minutes without incident. He was provided with Vaccine Information Sheet and instruction to access the V-Safe system.   Mr. Gary Romero was instructed to call 911 with any severe reactions post vaccine: Marland Kitchen Difficulty breathing  . Swelling of face and throat  . A fast heartbeat  . A bad rash all over body  . Dizziness and weakness   Immunizations Administered    Name Date Dose VIS Date Route   Pfizer COVID-19 Vaccine 11/17/2019 10:12 AM 0.3 mL 08/14/2019 Intramuscular   Manufacturer: [employer], [employer]   Lot: [id number]   NDC: [address]-2

## 2020-02-26 ENCOUNTER — Other Ambulatory Visit: Payer: Self-pay | Admitting: Pharmacist Clinician (PhC)/ Clinical Pharmacy Specialist

## 2020-02-26 MED ORDER — APIXABAN 5 MG PO TABS: ORAL_TABLET | ORAL | 0 refills | Status: DC

## 2020-02-26 NOTE — Telephone Encounter (Signed)
Last OV with Angie Duke 02/2019.  Labs 01/2019.  Will refill x 30 days for patient to have time to call office to schedule appt and labs

## 2020-02-29 ENCOUNTER — Telehealth: Payer: Self-pay | Admitting: Cardiology

## 2020-02-29 MED ORDER — ISOSORBIDE MONONITRATE ER 60 MG PO TB24: 60.0000 mg | ORAL_TABLET | Freq: Every day | ORAL | 0 refills | Status: DC

## 2020-02-29 MED ORDER — METOPROLOL TARTRATE 50 MG PO TABS: 50.0000 mg | ORAL_TABLET | Freq: Two times a day (BID) | ORAL | 0 refills | Status: DC

## 2020-02-29 MED ORDER — LISINOPRIL 10 MG PO TABS: 10.0000 mg | ORAL_TABLET | Freq: Every day | ORAL | 0 refills | Status: DC

## 2020-02-29 NOTE — Telephone Encounter (Signed)
Pt's medications were sent to pt's pharmacy as requested. Confirmation received. Pt has an upcoming appt in August.

## 2020-02-29 NOTE — Telephone Encounter (Signed)
*  STAT* If patient is at the pharmacy, call can be transferred to refill team.   1. Which medications need to be refilled? (please list name of each medication and dose if known)?  isosorbide mononitrate (IMDUR) 60 MG 24 hr tablet,  atorvastatin (LIPITOR) 80 MG tablet, lisinopril (ZESTRIL) 10 MG tablet  2. Which pharmacy/location (including street and city if local pharmacy) is medication to be sent to? [id number] - [place], [place] - [address] AT [facility] [address]  3. Do they need a 30 day or 90 day supply? 90 days    Patient states he is going out of town Wednesday am, but states his drug store will not be open when he is going out of town.

## 2020-03-02 ENCOUNTER — Other Ambulatory Visit: Payer: Self-pay

## 2020-03-02 MED ORDER — ATORVASTATIN CALCIUM 80 MG PO TABS: 80.0000 mg | ORAL_TABLET | Freq: Every day | ORAL | 3 refills | Status: DC

## 2020-03-28 ENCOUNTER — Other Ambulatory Visit: Payer: Self-pay

## 2020-03-28 MED ORDER — APIXABAN 5 MG PO TABS: ORAL_TABLET | ORAL | 1 refills | Status: DC
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
## 2020-04-06 ENCOUNTER — Other Ambulatory Visit: Payer: Self-pay

## 2020-04-06 ENCOUNTER — Ambulatory Visit (INDEPENDENT_AMBULATORY_CARE_PROVIDER_SITE_OTHER): Payer: Medicare Other | Admitting: Physician Assistant

## 2020-04-06 ENCOUNTER — Encounter: Payer: Self-pay | Admitting: Physician Assistant

## 2020-04-06 VITALS — BP 110/74 | HR 68 | Ht 73.0 in | Wt 291.0 lb

## 2020-04-06 DIAGNOSIS — I2510 Atherosclerotic heart disease of native coronary artery without angina pectoris: Secondary | ICD-10-CM

## 2020-04-06 DIAGNOSIS — I480 Paroxysmal atrial fibrillation: Secondary | ICD-10-CM | POA: Diagnosis not present

## 2020-04-06 DIAGNOSIS — E785 Hyperlipidemia, unspecified: Secondary | ICD-10-CM | POA: Diagnosis not present

## 2020-04-06 DIAGNOSIS — I10 Essential (primary) hypertension: Secondary | ICD-10-CM | POA: Diagnosis not present

## 2020-04-06 DIAGNOSIS — S81802A Unspecified open wound, left lower leg, initial encounter: Secondary | ICD-10-CM

## 2020-04-06 LAB — COMPREHENSIVE METABOLIC PANEL
ALT: 15 IU/L (ref 0–44)
AST: 15 IU/L (ref 0–40)
Albumin/Globulin Ratio: 1.7 (ref 1.2–2.2)
Albumin: 4.1 g/dL (ref 3.8–4.8)
Alkaline Phosphatase: 101 IU/L (ref 48–121)
BUN/Creatinine Ratio: 14 (ref 10–24)
BUN: 15 mg/dL (ref 8–27)
Bilirubin Total: 0.4 mg/dL (ref 0.0–1.2)
CO2: 22 mmol/L (ref 20–29)
Calcium: 8.4 mg/dL — ABNORMAL LOW (ref 8.6–10.2)
Chloride: 103 mmol/L (ref 96–106)
Creatinine, Ser: 1.04 mg/dL (ref 0.76–1.27)
GFR calc Af Amer: 85 mL/min/{1.73_m2} (ref >59)
GFR calc non Af Amer: 74 mL/min/{1.73_m2} (ref >59)
Globulin, Total: 2.4 g/dL (ref 1.5–4.5)
Glucose: 78 mg/dL (ref 65–99)
Potassium: 4.6 mmol/L (ref 3.5–5.2)
Sodium: 137 mmol/L (ref 134–144)
Total Protein: 6.5 g/dL (ref 6.0–8.5)

## 2020-04-06 LAB — CBC
Hematocrit: 37.7 % (ref 37.5–51.0)
Hemoglobin: 13.1 g/dL (ref 13.0–17.7)
MCH: 30.1 pg (ref 26.6–33.0)
MCHC: 34.7 g/dL (ref 31.5–35.7)
MCV: 87 fL (ref 79–97)
Platelets: 141 10*3/uL — ABNORMAL LOW (ref 150–450)
RBC: 4.35 x10E6/uL (ref 4.14–5.80)
RDW: 13.6 % (ref 11.6–15.4)
WBC: 6.1 10*3/uL (ref 3.4–10.8)

## 2020-04-06 LAB — LIPID PANEL
Chol/HDL Ratio: 2.9 ratio (ref 0.0–5.0)
Cholesterol, Total: 115 mg/dL (ref 100–199)
HDL: 39 mg/dL — ABNORMAL LOW (ref >39)
LDL Chol Calc (NIH): 41 mg/dL (ref 0–99)
Triglycerides: 224 mg/dL — ABNORMAL HIGH (ref 0–149)
VLDL Cholesterol Cal: 35 mg/dL (ref 5–40)

## 2020-04-06 MED ORDER — METOPROLOL TARTRATE 50 MG PO TABS: 50.0000 mg | ORAL_TABLET | Freq: Two times a day (BID) | ORAL | 3 refills | Status: DC

## 2020-04-06 MED ORDER — ISOSORBIDE MONONITRATE ER 60 MG PO TB24: 60.0000 mg | ORAL_TABLET | Freq: Every day | ORAL | 3 refills | Status: DC

## 2020-04-06 MED ORDER — APIXABAN 5 MG PO TABS: ORAL_TABLET | ORAL | 1 refills | Status: DC

## 2020-04-06 MED ORDER — ATORVASTATIN CALCIUM 80 MG PO TABS: 80.0000 mg | ORAL_TABLET | Freq: Every day | ORAL | 3 refills | Status: DC

## 2020-04-06 NOTE — Patient Instructions (Addendum)
Medication Instructions:  Your physician recommends that you continue on your current medications as directed. Please refer to the Current Medication list given to you today.  I have sent in refills for the Metoprolol, Imdur, Atorvastatin and Eliquis.  I have called the pharmacy and cancelled the Lisinopril prescription.  *If you need a refill on your cardiac medications before your next appointment, please call your pharmacy*   Lab Work: TODAY:  CMET, LIPID, & CBC  If you have labs (blood work) drawn today and your tests are completely normal, you will receive your results only by: Marland Kitchen MyChart Message (if you have MyChart) OR . A paper copy in the mail If you have any lab test that is abnormal or we need to change your treatment, we will call you to review the results.   Testing/Procedures: None ordered   Follow-Up: At [facility], you and your health needs are our priority.  As part of our continuing mission to provide you with exceptional heart care, we have created designated Provider Care Teams.  These Care Teams include your primary Cardiologist (physician) and Advanced Practice Providers (APPs -  Physician Assistants and Nurse Practitioners) who all work together to provide you with the care you need, when you need it.  We recommend signing up for the patient portal called "MyChart".  Sign up information is provided on this After Visit Summary.  MyChart is used to connect with patients for Virtual Visits (Telemedicine).  Patients are able to view lab/test results, encounter notes, upcoming appointments, etc.  Non-urgent messages can be sent to your provider as well.   To learn more about what you can do with MyChart, go to [email or web address].    Your next appointment:   5 month(s)  The format for your next appointment:   In Person  Provider:   You may see Tobias Alexander, MD or one of the following Advanced Practice Providers on your designated Care Team:    Ronie Spies, PA-C  Jacolyn Reedy, PA-C    Other Instructions

## 2020-04-07 ENCOUNTER — Telehealth: Payer: Self-pay | Admitting: *Deleted

## 2020-04-07 ENCOUNTER — Telehealth: Payer: Self-pay | Admitting: Physician Assistant

## 2020-04-07 NOTE — Telephone Encounter (Signed)
**Note De-Identified Gennie Eisinger Obfuscation** The pt states that he just recently retired and used his commercial ins card for the last time today for his medications.  He states that when he went to pick his prescriptions up they were all there except for his Eliquis and that he was advised by his pharmacy that his commercial ins plan did not cover his Eliquis this time. He also states that his commercial ins has ran out at this time.  He states that he has his Medicare card and has not ever used it but plans to tomorrow when he goes back to his pharmacy with his Medicare card to pick up his Eliquis if they cover it. He states that there is no way he can get back to his pharmacy today with his medicare card as he has a lot going on right now.  He is out of Eliquis and is concerned about the cost of it as he was advised that it will cost him $1400/30 day supply by the pharmacist.   I have advised him that we will leave him a couple boxes of Eliquis 5 mg samples in the front office (AKA: Dayna Dunn's office as he had a f/u with her yesterday) for him to pick up.  He is aware to call Larita Fife at [phone] if he is unable to afford his Eliquis.

## 2020-04-07 NOTE — Telephone Encounter (Signed)
Returned call to pt and he has been made aware of his lab results.  Pt was given the [facility] # to help find a pcp. .   Pt states that the Eliquis is too expensive on his medicare plan and was wanting to see about patient assistance / copay reduction on Eliquis.  Pt is concerned that he is running out of medication and will need some samples to get him through.  I will also route to the refill dept to help with this.   Pt was grateful for any and all the help that is given to him.

## 2020-04-07 NOTE — Telephone Encounter (Signed)
-----   Message from Laurann Montana, [place] sent at 04/07/2020  7:46 AM EDT ----- Please let pt know labs are stable except 2 abnormalities to f/u with primary care for - mildly decreased calcium level (persistent) and platelet count mildly decreased, both have been seen in prior labs so not acute. LDL looks good but triglycerides were elevated likely due to having eaten so I think overall labs are reassuring. Please provide number for PCP hotline and find out what urgent care did for his leg - was instructed to see them yesterday for possible debridement/culturing. Dayna Dunn PA-C

## 2020-04-07 NOTE — Telephone Encounter (Signed)
Hey Lynn, LPN, can you please advise on this matter? Thanks  ?

## 2020-04-07 NOTE — Telephone Encounter (Signed)
° ° ° °  Pt is returning call from [place] for lab results

## 2020-04-07 NOTE — Telephone Encounter (Signed)
Will do, thanks

## 2020-04-11 ENCOUNTER — Telehealth: Payer: Self-pay | Admitting: Cardiology

## 2020-04-11 NOTE — Telephone Encounter (Signed)
    Pt is calling, he said he's on his way to pick up his eliquis samples

## 2020-05-07 ENCOUNTER — Other Ambulatory Visit: Payer: Self-pay | Admitting: Cardiology

## 2020-11-14 ENCOUNTER — Other Ambulatory Visit: Payer: Self-pay | Admitting: Cardiology

## 2020-11-14 NOTE — Telephone Encounter (Signed)
Pt's age 70, wt 132 kg, SCr 1.04, CrCl 126.92, last ov w/ DD 04/06/20.

## 2021-04-18 ENCOUNTER — Other Ambulatory Visit: Payer: Self-pay | Admitting: Physician Assistant

## 2021-05-16 ENCOUNTER — Other Ambulatory Visit: Payer: Self-pay | Admitting: Physician Assistant

## 2021-05-16 NOTE — Telephone Encounter (Signed)
Prescription refill request for Eliquis received.  Indication: Afib  Last office visit: 04/06/2020, Dunn Scr: 1.04, 04/06/2020 Age: 70 yo  Weight: 132 kg   Overdue for office visit and for labs. Message sent to scheduling.

## 2021-05-22 NOTE — Telephone Encounter (Signed)
Prescription refill request for Eliquis received. Indication: Afib  Last office visit:04/06/20 (Office visit/labs scheduled with Dr Shari Prows on 09/29/21)  Scr: 1.04 (04/06/20)  Age: 70 Weight: 132kg  Appropriate dose and refill sent to requested pharmacy.

## 2021-06-17 ENCOUNTER — Other Ambulatory Visit: Payer: Self-pay | Admitting: Physician Assistant

## 2021-06-19 ENCOUNTER — Other Ambulatory Visit: Payer: Self-pay | Admitting: Physician Assistant

## 2021-08-07 ENCOUNTER — Other Ambulatory Visit: Payer: Self-pay

## 2021-08-07 MED ORDER — ATORVASTATIN CALCIUM 80 MG PO TABS: ORAL_TABLET | ORAL | 1 refills | Status: DC

## 2021-09-01 ENCOUNTER — Other Ambulatory Visit: Payer: Self-pay | Admitting: Physician Assistant

## 2021-09-25 NOTE — Progress Notes (Deleted)
Cardiology Office Note:    Date:  09/25/2021   ID:  [name], DOB [birth year], MRN [id number]  PCP:  Patient, No Pcp Per (Inactive)   [facility] Providers Cardiologist:  Ena Dawley, MD { }    Referring MD: No ref. provider found     History of Present Illness:    Gary Romero is a 71 y.o. 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  Per review of the record, the patient has a history of MI and multivessel CAD by catheterization in 2009 which was treated medically. He moved away after that and had not returned to care until last 2020. He was admitted 5/22-5/27/20 with indigestion and shortness of breath. He was found to be in atrial fib with but spontaneously converted to NSR. His troponin peaked at 8.37 and WBC was up. He underwent cath showing multivessel CAD including chronic total occlusion of mid LAD with relatively well-formed collaterals from PDA, total occlusion of proximal to mid circumflex (appeared to be a more recent occlusion) with faint collaterals, hyperdominant RCA with irregularities noted throughout the proximal, mid, and distal vessel.  Medical therapy was recommended. If patient continued to have angina, it was felt he may need mechanical revascularization with CABG as a means of more total revascularization vs multi-site PCI (but this was felt less ideal). TTE 01/24/19 showed EF 0000000, diastolic dysfunction, normal RV, trivial pericardial effusion. He was started on Eliquis for his PAF as well as lisinopril, metoprolol, atorvastatin, Imdur and aspirin. Nuclear stress test was recommended as outpatient to assess ischemic burden which showed "Low risk stress nuclear study with a medium-sized severe lateral perfusion defect (mostly scar with limited peri-infarct ischemia). Mildly reduced global left ventricular systolic function EF [id number]." The case was discussed with Gary Romero who  reviewed cath films and suggested that he likely would not be a CABG candidate due to poor targets and also noted he would also not be a candidate for CTO. Therefore medical therapy was recommended.   Last saw Gary Romero on 04/2020 where he was doing well from a CV perspective.  Today, ***  Past Medical History:  Diagnosis Date   CAD (coronary artery disease)    a. 2009: multivessel CAD by cath - medically treated; b. 01/27/2019: CTO of mid LAD with collaterals; total occlusion of prox to mid CX wih faint collaterals; hyperdominant RCA with irregularities throughout prox, mid, distal vessel - medically treated   Heart attack ([place])    Hyperlipidemia    Hypertension    Paroxysmal atrial fibrillation ([place]) 01/2019    Past Surgical History:  Procedure Laterality Date   LEFT HEART CATH AND CORONARY ANGIOGRAPHY N/A 01/27/2019   Procedure: LEFT HEART CATH AND CORONARY ANGIOGRAPHY;  Surgeon: Belva Crome, MD;  Location: [facility];  Service: Cardiovascular;  Laterality: N/A;   PELVIC FRACTURE SURGERY      Current Medications: No outpatient medications have been marked as taking for the 09/29/21 encounter (Appointment) with Freada Bergeron, MD.     Allergies:   Patient has no known allergies.   Social History   Socioeconomic History   Marital status: Single    Spouse name: Not on file   Number of children: Not on file   Years of education: Not on file   Highest education level: Not on file  Occupational History   Not on file  Tobacco Use   Smoking status: Never  Smokeless tobacco: Never  Vaping Use   Vaping Use: Never used  Substance and Sexual Activity   Alcohol use: Yes    Comment: occ   Drug use: Never   Sexual activity: Not on file  Other Topics Concern   Not on file  Social History Narrative   Not on file   Social Determinants of Health   Financial Resource Strain: Not on file  Food Insecurity: Not on file  Transportation Needs: Not on file   Physical Activity: Not on file  Stress: Not on file  Social Connections: Not on file     Family History: The patient's ***family history includes Breast cancer in his mother; CAD in his father.  ROS:   Please see the history of present illness.    *** All other systems reviewed and are negative.  EKGs/Labs/Other Studies Reviewed:    The following studies were reviewed today: NST 03/03/2019 Nuclear stress EF: 50%. There was no ST segment deviation noted during stress. No T wave inversion was noted during stress. Defect 1: There is a medium defect of severe severity present in the basal inferolateral, basal anterolateral and mid anterolateral location. Findings consistent with prior myocardial infarction with peri-infarct ischemia. This is a low risk study. The left ventricular ejection fraction is mildly decreased (45-54%).   Low risk stress nuclear study with a medium-sized severe lateral perfusion defect (mostly scar with limited peri-infarct ischemia). Mildly reduced global eft ventricular systolic function.   LHC 01/2019 Total occlusion mid LAD beyond the first diagonal (chronic total).  Relatively well-formed collaterals from PDA.  The first diagonal is relatively large. Total occlusion proximal to mid circumflex (appears to be a more recent occlusion).  Very faint collaterals are noted. Left main is widely patent. Widely patent ramus intermedius. Hyperdominant RCA with irregularities noted throughout the proximal mid and distal vessel.  PDA ostium contains 60% eccentric narrowing, proximal P DA contains 50% narrowing, mid PDA contains 50% narrowing.  PDA supplies collaterals around the apex to the LAD.  Continuation of the RCA before 2 marginal branches contain 70% narrowing. Overall preserved LV function with EF 55%.  Anterior wall apex and inferoapical region revealed normal contractility.  Inferobasal region is now well identified.  EDP 19 mmHg.   RECOMMENDATIONS:   Medical  therapy for underlying coronary disease.  If anginal complaints despite medical therapy, consider mechanical revascularization with CABG as a means of more total revascularization versus multisite PCI on the right coronary (less appealing). Begin apixaban 12 hours post cath. In interim we will restart IV heparin until apixaban can be given.  Once apixaban started, heparin will be discontinued.     2D echo 01/2019 IMPRESSIONS     1. The left ventricle has normal systolic function, with an ejection  fraction of 55-60%. The cavity size was normal. Left ventricular diastolic  Doppler parameters are consistent with pseudonormalization.   2. The right ventricle has normal systolic function. The cavity was  normal. There is no increase in right ventricular wall thickness.   3. Trivial pericardial effusion is present.   4. The interatrial septum was not assessed.   EKG:  EKG is *** ordered today.  The ekg ordered today demonstrates ***  Recent Labs: No results found for requested labs within last 8760 hours.  Recent Lipid Panel    Component Value Date/Time   CHOL 115 04/06/2020 1211   TRIG 224 (H) 04/06/2020 1211   HDL 39 (L) 04/06/2020 1211   CHOLHDL 2.9 04/06/2020 1211  CHOLHDL 3.1 01/24/2019 0721   VLDL 24 01/24/2019 0721   LDLCALC 41 04/06/2020 1211     Risk Assessment/Calculations:   {Does this patient have ATRIAL FIBRILLATION?:[phone]}       Physical Exam:    VS:  There were no vitals taken for this visit.    Wt Readings from Last 3 Encounters:  04/06/20 291 lb (132 kg)  02/20/19 273 lb (123.8 kg)  02/10/19 268 lb (121.6 kg)     GEN: *** Well nourished, well developed in no acute distress HEENT: Normal NECK: No JVD; No carotid bruits LYMPHATICS: No lymphadenopathy CARDIAC: ***RRR, no murmurs, rubs, gallops RESPIRATORY:  Clear to auscultation without rales, wheezing or rhonchi  ABDOMEN: Soft, non-tender, non-distended MUSCULOSKELETAL:  No edema; No deformity   SKIN: Warm and dry NEUROLOGIC:  Alert and oriented x 3 PSYCHIATRIC:  Normal affect   ASSESSMENT:    No diagnosis found. PLAN:    In order of problems listed above:  #Multivessel CAD: Last cath 01/2019 with multivessel CAD including chronic total occlusion of mid LAD with relatively well-formed collaterals from PDA, total occlusion of proximal to mid circumflex (appeared to be a more recent occlusion) with faint collaterals, hyperdominant RCA with irregularities noted throughout the proximal, mid, and distal vessel.  Medical therapy was recommended. Deemed unlikely to be CABG candidate. Currently doing well without anginal symptoms. -Continue ASA 81mg  daily -Continue lipitor 80mg  daily -Change metop to XL 50mg  daily -Continue Imdur 60mg  daily -Nitro prn  #Paroxysmal Afib: CHADs-vasc 3. On apixaban -Continue apixaban 5mg  BID -Change metop to 50mg  XL daily as above  #HTN: -Change metop to XL 50mg  daily -Continue Imdur 60mg  daily  #HLD:      {Are you ordering a CV Procedure (e.g. stress test, cath, DCCV, TEE, etc)?   Press F2        :[id number]    Medication Adjustments/Labs and Tests Ordered: Current medicines are reviewed at length with the patient today.  Concerns regarding medicines are outlined above.  No orders of the defined types were placed in this encounter.  No orders of the defined types were placed in this encounter.   There are no Patient Instructions on file for this visit.   Signed, Freada Bergeron, MD  09/25/2021 3:27 PM    [place]

## 2021-09-29 ENCOUNTER — Other Ambulatory Visit: Payer: Self-pay

## 2021-09-29 ENCOUNTER — Encounter: Payer: Self-pay | Admitting: Cardiology

## 2021-09-29 ENCOUNTER — Ambulatory Visit: Payer: Medicare Other | Admitting: Cardiology

## 2021-09-29 VITALS — BP 124/64 | HR 73 | Ht 72.0 in | Wt 280.4 lb

## 2021-09-29 DIAGNOSIS — I25118 Atherosclerotic heart disease of native coronary artery with other forms of angina pectoris: Secondary | ICD-10-CM | POA: Diagnosis not present

## 2021-09-29 DIAGNOSIS — I10 Essential (primary) hypertension: Secondary | ICD-10-CM

## 2021-09-29 DIAGNOSIS — E785 Hyperlipidemia, unspecified: Secondary | ICD-10-CM | POA: Diagnosis not present

## 2021-09-29 DIAGNOSIS — Z79899 Other long term (current) drug therapy: Secondary | ICD-10-CM | POA: Diagnosis not present

## 2021-09-29 DIAGNOSIS — I480 Paroxysmal atrial fibrillation: Secondary | ICD-10-CM

## 2021-09-29 MED ORDER — METOPROLOL SUCCINATE ER 50 MG PO TB24: 50.0000 mg | ORAL_TABLET | Freq: Every day | ORAL | 3 refills | Status: DC

## 2021-09-29 MED ORDER — ATORVASTATIN CALCIUM 80 MG PO TABS
ORAL_TABLET | ORAL | 3 refills | Status: DC
Start: 2021-09-29 — End: 2022-12-03

## 2021-09-29 NOTE — Progress Notes (Signed)
Cardiology Office Note:    Date:  09/29/2021   ID:  Gary Romero, DOB [birth year], MRN [id number]  PCP:  Patient, No Pcp Per (Inactive)   [facility] Providers Cardiologist:  Ena Dawley, MD     Referring MD: No ref. provider found    History of Present Illness:    Gary Romero is a 71 y.o. 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  Per review of the record, the patient has a history of MI and multivessel CAD by catheterization in 2009 which was treated medically. He moved away after that and had not returned to care until last 2020. He was admitted 5/22-5/27/20 with indigestion and shortness of breath. He was found to be in atrial fib with but spontaneously converted to NSR. His troponin peaked at 8.37 and WBC was up. He underwent cath showing multivessel CAD including chronic total occlusion of mid LAD with relatively well-formed collaterals from PDA, total occlusion of proximal to mid circumflex (appeared to be a more recent occlusion) with faint collaterals, hyperdominant RCA with irregularities noted throughout the proximal, mid, and distal vessel.  Medical therapy was recommended. If patient continued to have angina, it was felt he may need mechanical revascularization with CABG as a means of more total revascularization vs multi-site PCI (but this was felt less ideal). TTE 01/24/19 showed EF 80-03%, diastolic dysfunction, normal RV, trivial pericardial effusion. He was started on Eliquis for his PAF as well as lisinopril, metoprolol, atorvastatin, Imdur and aspirin. Nuclear stress test was recommended as outpatient to assess ischemic burden which showed "Low risk stress nuclear study with a medium-sized severe lateral perfusion defect (mostly scar with limited peri-infarct ischemia). Mildly reduced global left ventricular systolic function EF 49%." The case was discussed with Dr. Martin[place] who  reviewed cath films and suggested that he likely would not be a CABG candidate due to poor targets and also noted he would also not be a candidate for CTO. Therefore medical therapy was recommended.   Last saw Melina Copa on 04/2020 where he was doing well from a CV perspective.  Today, he is doing alright with no major concerns. Denies any chest pain, SOB, DOE, lightheadedness or dizziness. He has occasional swelling in his ankles after standing at the end of the day. Does not monitor blood pressures at home but has been well controlled here. He has not taken his medications today but is compliant with taking them routinely. He has never used his nitroglycerin. He does not feel when he has an Afib episode.   He endorses occasional blood in his stool. He describes his stool as firm and notices the blood when he wipes. However, he has not seen a gastroenterologist and is trying to find a PCP.  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 For his diet, he usually eats fast food.   The patient denies chest pain, chest pressure, dyspnea at rest or with exertion, PND, or orthopnea. He denies cough, fever, chills, nausea, or vomiting. He denies syncope or presyncope, dizziness or lightheadedness, or snoring.  Past Medical History:  Diagnosis Date   CAD (coronary artery disease)    a. 2009: multivessel CAD by cath - medically treated; b. 01/27/2019: CTO of mid LAD with collaterals; total occlusion of prox to mid CX wih faint collaterals; hyperdominant RCA with irregularities throughout prox, mid, distal vessel - medically treated   Heart attack ([place])    Hyperlipidemia  Hypertension    Paroxysmal atrial fibrillation ([place]) 01/2019    Past Surgical History:  Procedure Laterality Date   LEFT HEART CATH AND CORONARY ANGIOGRAPHY N/A 01/27/2019   Procedure: LEFT HEART CATH AND CORONARY ANGIOGRAPHY;  Surgeon: Belva Crome, MD;  Location: [facility];  Service: Cardiovascular;   Laterality: N/A;   PELVIC FRACTURE SURGERY      Current Medications: Current Meds  Medication Sig   aspirin 81 MG chewable tablet Chew 1 tablet (81 mg total) by mouth daily.   ELIQUIS 5 MG TABS tablet TAKE 1 TABLET BY MOUTH TWICE DAILY   isosorbide mononitrate (IMDUR) 60 MG 24 hr tablet TAKE 1 TABLET(60 MG) BY MOUTH DAILY   metoprolol succinate (TOPROL-XL) 50 MG 24 hr tablet Take 1 tablet (50 mg total) by mouth daily. Take with or immediately following a meal.   nitroGLYCERIN (NITROSTAT) 0.4 MG SL tablet Place 1 tablet (0.4 mg total) under the tongue every 5 (five) minutes as needed for chest pain.   [DISCONTINUED] atorvastatin (LIPITOR) 80 MG tablet TAKE 1 TABLET(80 MG) BY MOUTH DAILY AT 6 PM. Please keep upcoming appt in January 2023 with Dr. Johney Frame before anymore refills. Thank you   [DISCONTINUED] metoprolol tartrate (LOPRESSOR) 50 MG tablet TAKE 1 TABLET(50 MG) BY MOUTH TWICE DAILY     Allergies:   Patient has no known allergies.   Social History   Socioeconomic History   Marital status: Single    Spouse name: Not on file   Number of children: Not on file   Years of education: Not on file   Highest education level: Not on file  Occupational History   Not on file  Tobacco Use   Smoking status: Never   Smokeless tobacco: Never  Vaping Use   Vaping Use: Never used  Substance and Sexual Activity   Alcohol use: Yes    Comment: occ   Drug use: Never   Sexual activity: Not on file  Other Topics Concern   Not on file  Social History Narrative   Not on file   Social Determinants of Health   Financial Resource Strain: Not on file  Food Insecurity: Not on file  Transportation Needs: Not on file  Physical Activity: Not on file  Stress: Not on file  Social Connections: Not on file     Family History: The patient's family history includes Breast cancer in his mother; CAD in his father.  ROS:   Please see the history of present illness.    Review of Systems   Constitutional:  Negative for fever and malaise/fatigue.  HENT:  Negative for congestion.   Eyes:  Negative for blurred vision and double vision.  Respiratory:  Negative for cough and shortness of breath.   Cardiovascular:  Positive for leg swelling (bilateral ankles). Negative for chest pain, palpitations, orthopnea, claudication and PND.  Gastrointestinal:  Positive for blood in stool. Negative for heartburn and nausea.  Genitourinary:  Negative for dysuria.  Musculoskeletal:  Negative for joint pain and myalgias.  Skin:  Negative for itching and rash.  Neurological:  Negative for dizziness and headaches.  Endo/Heme/Allergies:  Does not bruise/bleed easily.  Psychiatric/Behavioral:  The patient is not nervous/anxious and does not have insomnia.   All other systems reviewed and are negative.  EKGs/Labs/Other Studies Reviewed:    The following studies were reviewed today: NST 07-Mar-2019 Nuclear stress EF: 50%. There was no ST segment deviation noted during stress. No T wave inversion was noted during stress. Defect  1: There is a medium defect of severe severity present in the basal inferolateral, basal anterolateral and mid anterolateral location. Findings consistent with prior myocardial infarction with peri-infarct ischemia. This is a low risk study. The left ventricular ejection fraction is mildly decreased (45-54%).   Low risk stress nuclear study with a medium-sized severe lateral perfusion defect (mostly scar with limited peri-infarct ischemia). Mildly reduced global eft ventricular systolic function.   LHC 01/2019 Total occlusion mid LAD beyond the first diagonal (chronic total).  Relatively well-formed collaterals from PDA.  The first diagonal is relatively large. Total occlusion proximal to mid circumflex (appears to be a more recent occlusion).  Very faint collaterals are noted. Left main is widely patent. Widely patent ramus intermedius. Hyperdominant RCA with irregularities  noted throughout the proximal mid and distal vessel.  PDA ostium contains 60% eccentric narrowing, proximal P DA contains 50% narrowing, mid PDA contains 50% narrowing.  PDA supplies collaterals around the apex to the LAD.  Continuation of the RCA before 2 marginal branches contain 70% narrowing. Overall preserved LV function with EF 55%.  Anterior wall apex and inferoapical region revealed normal contractility.  Inferobasal region is now well identified.  EDP 19 mmHg.   RECOMMENDATIONS:   Medical therapy for underlying coronary disease.  If anginal complaints despite medical therapy, consider mechanical revascularization with CABG as a means of more total revascularization versus multisite PCI on the right coronary (less appealing). Begin apixaban 12 hours post cath. In interim we will restart IV heparin until apixaban can be given.  Once apixaban started, heparin will be discontinued.     2D echo 01/2019 IMPRESSIONS   1. The left ventricle has normal systolic function, with an ejection  fraction of 55-60%. The cavity size was normal. Left ventricular diastolic  Doppler parameters are consistent with pseudonormalization.   2. The right ventricle has normal systolic function. The cavity was  normal. There is no increase in right ventricular wall thickness.   3. Trivial pericardial effusion is present.   4. The interatrial septum was not assessed.   EKG:   09/29/21: Sinus rhythm, rate 73 bpm  Recent Labs: No results found for requested labs within last 8760 hours.  Recent Lipid Panel    Component Value Date/Time   CHOL 115 04/06/2020 1211   TRIG 224 (H) 04/06/2020 1211   HDL 39 (L) 04/06/2020 1211   CHOLHDL 2.9 04/06/2020 1211   CHOLHDL 3.1 01/24/2019 0721   VLDL 24 01/24/2019 0721   LDLCALC 41 04/06/2020 1211     Risk Assessment/Calculations:    CHA2DS2-VASc Score = 3  This indicates a 3.2% annual risk of stroke. The patient's score is based upon: CHF History: 0 HTN History:  1 Diabetes History: 0 Stroke History: 0 Vascular Disease History: 1 Age Score: 1 Gender Score: 0          Physical Exam:    VS:  BP 124/64    Pulse 73    Ht 6' (1.829 m)    Wt 280 lb 6.4 oz (127.2 kg)    SpO2 96%    BMI 38.03 kg/m     Wt Readings from Last 3 Encounters:  09/29/21 280 lb 6.4 oz (127.2 kg)  04/06/20 291 lb (132 kg)  02/20/19 273 lb (123.8 kg)     GEN: Well nourished, well developed in no acute distress HEENT: Normal NECK: No JVD; No carotid bruits CARDIAC: RRR, no murmurs, rubs, gallops RESPIRATORY:  Clear to auscultation without rales, wheezing or rhonchi  ABDOMEN: Soft, non-tender, non-distended MUSCULOSKELETAL:  No edema; No deformity  SKIN: Warm and dry NEUROLOGIC:  Alert and oriented x 3 PSYCHIATRIC: Flat affect   ASSESSMENT:    1. Coronary artery disease of native artery of native heart with stable angina pectoris ([place])   2. Medication management   3. Hyperlipidemia LDL goal <70   4. Essential hypertension   5. PAF (paroxysmal atrial fibrillation) ([facility])    PLAN:    In order of problems listed above:  #Multivessel CAD: Last cath 01/2019 with multivessel CAD including chronic total occlusion of mid LAD with relatively well-formed collaterals from PDA, total occlusion of proximal to mid circumflex (appeared to be a more recent occlusion) with faint collaterals, hyperdominant RCA with irregularities noted throughout the proximal, mid, and distal vessel.  Medical therapy was recommended. Deemed unlikely to be CABG candidate. Currently doing well without anginal symptoms. -Continue ASA 35m daily -Continue lipitor 867mdaily -Change metop to XL 5020maily -Continue Imdur 3m15mily -Nitro prn (has not required)  #Paroxysmal Afib: CHADs-vasc 3. On apixaban. Occasional blood when wiping stool (? Hemorrhoids).  -Continue apixaban 5mg 64m -Change metop to 50mg 73maily as above -Check CBC -Will refer to PCP  #HTN: Well controlled at  home. -Change metop to XL 50mg d64m -Continue Imdur 3mg [id number] next week  #HLD: -Continue lipitor 80mg dai68mLipids next week          Medication Adjustments/Labs and Tests Ordered: Current medicines are reviewed at length with the patient today.  Concerns regarding medicines are outlined above.  Orders Placed This Encounter  Procedures   Comp Met (CMET)   CBC   Lipid Profile   EKG 12-Lead   Meds ordered this encounter  Medications   atorvastatin (LIPITOR) 80 MG tablet    Sig: TAKE 1 TABLET(80 MG) BY MOUTH DAILY AT 6 PM.    Dispense:  90 tablet    Refill:  3   metoprolol succinate (TOPROL-XL) 50 MG 24 hr tablet    Sig: Take 1 tablet (50 mg total) by mouth daily. Take with or immediately following a meal.    Dispense:  90 tablet    Refill:  3    D/C'ed lopressor and started on this    Patient Instructions  Medication Instructions:   STOP TAKING METOPROLOL TARTRATE (LOPRESSOR) NOW  START TAKING METOPROLOL SUCCINATE (TOPROL XL) 50 MG BY MOUTH DAILY  *If you need a refill on your cardiac medications before your next appointment, please call your pharmacy*   Lab Work:  ON NEXT Monday 10/02/21 HERE IN THE OFFICE--WE WILL CHECK LIPIDS, CMET, AND CBC AT THIS VISIT--PLEASE COME FASTING TO THIS LAB APPOINTMENT  If you have labs (blood work) drawn today and your tests are completely normal, you will receive your results only by: MyChart MEmpirehave MyChart) OR A paper copy in the mail If you have any lab test that is abnormal or we need to change your treatment, we will call you to review the results.   Follow-Up: At [facility] your health needs are our priority.  As part of our continuing mission to provide you with exceptional heart care, we have created designated Provider Care Teams.  These Care Teams include your primary Cardiologist (physician) and Advanced Practice Providers (APPs -  Physician Assistants and Nurse Practitioners) who all  work together to provide you with the care you need, when you need it.  We recommend signing up for the patient portal  called "MyChart".  Sign up information is provided on this After Visit Summary.  MyChart is used to connect with patients for Virtual Visits (Telemedicine).  Patients are able to view lab/test results, encounter notes, upcoming appointments, etc.  Non-urgent messages can be sent to your provider as well.   To learn more about what you can do with MyChart, go to [email or web address].    Your next appointment:   1 year(s)  The format for your next appointment:   In Person  Provider:   DR. Johney Frame OR AN EXTENDER     Wilhemina Bonito as a scribe for Freada Bergeron, MD.,have documented all relevant documentation on the behalf of Freada Bergeron, MD,as directed by  Freada Bergeron, MD while in the presence of Freada Bergeron, MD.  I, Freada Bergeron, MD, have reviewed all documentation for this visit. The documentation on 09/29/21 for the exam, diagnosis, procedures, and orders are all accurate and complete.   Signed, Freada Bergeron, MD  09/29/2021 10:37 AM    [place]

## 2021-09-29 NOTE — Patient Instructions (Signed)
Medication Instructions:   STOP TAKING METOPROLOL TARTRATE (LOPRESSOR) NOW  START TAKING METOPROLOL SUCCINATE (TOPROL XL) 50 MG BY MOUTH DAILY  *If you need a refill on your cardiac medications before your next appointment, please call your pharmacy*   Lab Work:  ON NEXT Monday 10/02/21 HERE IN THE OFFICE--WE WILL CHECK LIPIDS, CMET, AND CBC AT THIS VISIT--PLEASE COME FASTING TO THIS LAB APPOINTMENT  If you have labs (blood work) drawn today and your tests are completely normal, you will receive your results only by: MyChart Message (if you have MyChart) OR A paper copy in the mail If you have any lab test that is abnormal or we need to change your treatment, we will call you to review the results.   Follow-Up: At [facility], you and your health needs are our priority.  As part of our continuing mission to provide you with exceptional heart care, we have created designated Provider Care Teams.  These Care Teams include your primary Cardiologist (physician) and Advanced Practice Providers (APPs -  Physician Assistants and Nurse Practitioners) who all work together to provide you with the care you need, when you need it.  We recommend signing up for the patient portal called "MyChart".  Sign up information is provided on this After Visit Summary.  MyChart is used to connect with patients for Virtual Visits (Telemedicine).  Patients are able to view lab/test results, encounter notes, upcoming appointments, etc.  Non-urgent messages can be sent to your provider as well.   To learn more about what you can do with MyChart, go to [email or web address].    Your next appointment:   1 year(s)  The format for your next appointment:   In Person  Provider:   DR. Shari Prows OR AN EXTENDER

## 2021-10-02 ENCOUNTER — Other Ambulatory Visit: Payer: Medicare Other | Admitting: *Deleted

## 2021-10-02 ENCOUNTER — Other Ambulatory Visit: Payer: Self-pay

## 2021-10-02 DIAGNOSIS — E785 Hyperlipidemia, unspecified: Secondary | ICD-10-CM

## 2021-10-02 DIAGNOSIS — Z79899 Other long term (current) drug therapy: Secondary | ICD-10-CM

## 2021-10-02 DIAGNOSIS — I10 Essential (primary) hypertension: Secondary | ICD-10-CM

## 2021-10-02 DIAGNOSIS — I25118 Atherosclerotic heart disease of native coronary artery with other forms of angina pectoris: Secondary | ICD-10-CM

## 2021-10-02 LAB — COMPREHENSIVE METABOLIC PANEL
ALT: 14 IU/L (ref 0–44)
AST: 13 IU/L (ref 0–40)
Albumin/Globulin Ratio: 1.7 (ref 1.2–2.2)
Albumin: 4.2 g/dL (ref 3.8–4.8)
Alkaline Phosphatase: 101 IU/L (ref 44–121)
BUN/Creatinine Ratio: 15 (ref 10–24)
BUN: 17 mg/dL (ref 8–27)
Bilirubin Total: 0.6 mg/dL (ref 0.0–1.2)
CO2: 23 mmol/L (ref 20–29)
Calcium: 8.8 mg/dL (ref 8.6–10.2)
Chloride: 104 mmol/L (ref 96–106)
Creatinine, Ser: 1.11 mg/dL (ref 0.76–1.27)
Globulin, Total: 2.5 g/dL (ref 1.5–4.5)
Glucose: 87 mg/dL (ref 70–99)
Potassium: 4.4 mmol/L (ref 3.5–5.2)
Sodium: 140 mmol/L (ref 134–144)
Total Protein: 6.7 g/dL (ref 6.0–8.5)
eGFR: 72 mL/min/{1.73_m2} (ref >59)

## 2021-10-02 LAB — CBC
Hematocrit: 42.4 % (ref 37.5–51.0)
Hemoglobin: 14.4 g/dL (ref 13.0–17.7)
MCH: 30.3 pg (ref 26.6–33.0)
MCHC: 34 g/dL (ref 31.5–35.7)
MCV: 89 fL (ref 79–97)
Platelets: 147 10*3/uL — ABNORMAL LOW (ref 150–450)
RBC: 4.76 x10E6/uL (ref 4.14–5.80)
RDW: 13.9 % (ref 11.6–15.4)
WBC: 5.6 10*3/uL (ref 3.4–10.8)

## 2021-10-02 LAB — LIPID PANEL
Chol/HDL Ratio: 3.8 ratio (ref 0.0–5.0)
Cholesterol, Total: 147 mg/dL (ref 100–199)
HDL: 39 mg/dL — ABNORMAL LOW (ref >39)
LDL Chol Calc (NIH): 76 mg/dL (ref 0–99)
Triglycerides: 188 mg/dL — ABNORMAL HIGH (ref 0–149)
VLDL Cholesterol Cal: 32 mg/dL (ref 5–40)

## 2021-12-21 ENCOUNTER — Other Ambulatory Visit: Payer: Self-pay | Admitting: *Deleted

## 2021-12-21 DIAGNOSIS — I4891 Unspecified atrial fibrillation: Secondary | ICD-10-CM

## 2021-12-21 MED ORDER — APIXABAN 5 MG PO TABS: 5.0000 mg | ORAL_TABLET | Freq: Two times a day (BID) | ORAL | 2 refills | Status: DC

## 2021-12-21 NOTE — Telephone Encounter (Signed)
Eliquis 5mg  paper refill request received. Patient is 71 years old, weight-127.2kg, Crea-1.11 on 10/02/2021, Diagnosis-Afib, and last seen by Dr. Johney Frame on 09/29/2021. Dose is appropriate based on dosing criteria. Will send in refill to requested pharmacy.   ?

## 2022-07-19 ENCOUNTER — Other Ambulatory Visit: Payer: Self-pay

## 2022-07-19 DIAGNOSIS — I25118 Atherosclerotic heart disease of native coronary artery with other forms of angina pectoris: Secondary | ICD-10-CM

## 2022-07-19 DIAGNOSIS — Z79899 Other long term (current) drug therapy: Secondary | ICD-10-CM

## 2022-07-19 DIAGNOSIS — I10 Essential (primary) hypertension: Secondary | ICD-10-CM

## 2022-07-19 DIAGNOSIS — E785 Hyperlipidemia, unspecified: Secondary | ICD-10-CM

## 2022-07-19 MED ORDER — METOPROLOL SUCCINATE ER 50 MG PO TB24: 50.0000 mg | ORAL_TABLET | Freq: Every day | ORAL | 0 refills | Status: DC

## 2022-09-10 ENCOUNTER — Other Ambulatory Visit: Payer: Self-pay

## 2022-09-10 MED ORDER — ISOSORBIDE MONONITRATE ER 60 MG PO TB24: ORAL_TABLET | ORAL | 0 refills | Status: DC

## 2022-10-01 ENCOUNTER — Other Ambulatory Visit: Payer: Self-pay | Admitting: *Deleted

## 2022-11-27 ENCOUNTER — Other Ambulatory Visit: Payer: Self-pay

## 2022-11-27 ENCOUNTER — Other Ambulatory Visit: Payer: Self-pay | Admitting: *Deleted

## 2022-11-27 DIAGNOSIS — I4891 Unspecified atrial fibrillation: Secondary | ICD-10-CM

## 2022-11-27 MED ORDER — APIXABAN 5 MG PO TABS: 5.0000 mg | ORAL_TABLET | Freq: Two times a day (BID) | ORAL | 0 refills | Status: DC

## 2022-11-27 MED ORDER — ISOSORBIDE MONONITRATE ER 60 MG PO TB24: ORAL_TABLET | ORAL | 0 refills | Status: DC

## 2022-11-27 NOTE — Telephone Encounter (Signed)
Pt scheduled to see cardiologist on July 2024. Put on appointment note that pt will need blood work CBC and BMET.

## 2022-11-27 NOTE — Telephone Encounter (Signed)
Received faxed Eliquis refill request from [facility] in [place].  Pt last saw Dr Johney Frame 09/29/21, pt is overdue for follow-up.  Last labs 10/02/21 Creat 1.11, pt is overdue for labwork as well.  Pt will need appt for OV and labwork to refill rx.  Message sent to schedulers to contact pt for OV.  Will await appt to refill rx.

## 2022-11-27 NOTE — Telephone Encounter (Signed)
Prescription refill request for Eliquis received. Indication: afib  Last office visit:09/29/2021 Scr: 1.11, 10/02/2021 Age: 72 yo  Weight: 127.2 kg   Pt is overdue for blood work and for an office visit. Msg sent to schedulers.

## 2022-12-03 ENCOUNTER — Other Ambulatory Visit: Payer: Self-pay

## 2022-12-03 DIAGNOSIS — E785 Hyperlipidemia, unspecified: Secondary | ICD-10-CM

## 2022-12-03 DIAGNOSIS — I10 Essential (primary) hypertension: Secondary | ICD-10-CM

## 2022-12-03 DIAGNOSIS — I25118 Atherosclerotic heart disease of native coronary artery with other forms of angina pectoris: Secondary | ICD-10-CM

## 2022-12-03 DIAGNOSIS — Z79899 Other long term (current) drug therapy: Secondary | ICD-10-CM

## 2022-12-03 MED ORDER — ATORVASTATIN CALCIUM 80 MG PO TABS: ORAL_TABLET | ORAL | 0 refills | Status: DC

## 2022-12-06 ENCOUNTER — Other Ambulatory Visit: Payer: Self-pay | Admitting: *Deleted

## 2022-12-06 DIAGNOSIS — I4891 Unspecified atrial fibrillation: Secondary | ICD-10-CM

## 2022-12-06 MED ORDER — APIXABAN 5 MG PO TABS: 5.0000 mg | ORAL_TABLET | Freq: Two times a day (BID) | ORAL | 1 refills | Status: DC

## 2022-12-06 NOTE — Telephone Encounter (Signed)
Eliquis 5mg  refill request received. Patient is 72 years old, weight-127.2kg, Crea-1.11 on 10/02/21, Diagnosis-Afib, and last seen by Dr. Johney Frame on 09/29/21 and has appt on 03/15/23. Dose is appropriate based on dosing criteria.   Pt had a refill sent on 11/27/22 with only 3 month supply and the pt has an appt set for July. Will send in another refill since note has been added to appt for labs.

## 2022-12-10 ENCOUNTER — Other Ambulatory Visit: Payer: Self-pay | Admitting: *Deleted

## 2022-12-10 MED ORDER — ISOSORBIDE MONONITRATE ER 60 MG PO TB24: ORAL_TABLET | ORAL | 0 refills | Status: DC

## 2023-03-12 NOTE — Progress Notes (Signed)
Cardiology Office Note:    Date:  03/15/2023   ID:  Gary Romero, DOB 10-02-1950, MRN [id number]  PCP:  Patient, No Pcp Per   [facility] Providers Cardiologist:  Tobias Alexander, MD     Referring MD: No ref. provider found    History of Present Illness:    Gary Romero is a 72 y.o. 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  Per review of the record, the patient has a history of MI and multivessel CAD by catheterization in 2009 which was treated medically. He moved away after that and had not returned to care until last 2020. He was admitted 5/22-5/27/20 with indigestion and shortness of breath. He was found to be in atrial fib with but spontaneously converted to NSR. His troponin peaked at 8.37 and WBC was up. He underwent cath showing multivessel CAD including chronic total occlusion of mid LAD with relatively well-formed collaterals from PDA, total occlusion of proximal to mid circumflex (appeared to be a more recent occlusion) with faint collaterals, hyperdominant RCA with irregularities noted throughout the proximal, mid, and distal vessel.  Medical therapy was recommended. If patient continued to have angina, it was felt he may need mechanical revascularization with CABG as a means of more total revascularization vs multi-site PCI (but this was felt less ideal). TTE 01/24/19 showed EF 55-60%, diastolic dysfunction, normal RV, trivial pericardial effusion. He was started on Eliquis for his PAF as well as lisinopril, metoprolol, atorvastatin, Imdur and aspirin. Nuclear stress test was recommended as outpatient to assess ischemic burden which showed "Low risk stress nuclear study with a medium-sized severe lateral perfusion defect (mostly scar with limited peri-infarct ischemia). Mildly reduced global left ventricular systolic function EF 50%." The case was discussed with Dr. [place] who reviewed cath  films and suggested that he likely would not be a CABG candidate due to poor targets and also noted he would also not be a candidate for CTO. Therefore medical therapy was recommended.   Last seen in clinic on 03/2022 where he was doing well from a CV perspective.  Today, the patient overall feels well. No chest pain, SOB, orthopnea, or PND. No palpitations, lightheadedness, dizziness or syncope. Has chronic dyspnea on exertion that is unchanged. States he had a hard time getting his medications and he is off his apixaban and lipitor. Has not needed his nitroglycerin. He remains fairly active and is able to walk without exertional symptoms.   Past Medical History:  Diagnosis Date   CAD (coronary artery disease)    a. 2009: multivessel CAD by cath - medically treated; b. 01/27/2019: CTO of mid LAD with collaterals; total occlusion of prox to mid CX wih faint collaterals; hyperdominant RCA with irregularities throughout prox, mid, distal vessel - medically treated   Heart attack (HCC)    Hyperlipidemia    Hypertension    Paroxysmal atrial fibrillation (HCC) 01/2019    Past Surgical History:  Procedure Laterality Date   LEFT HEART CATH AND CORONARY ANGIOGRAPHY N/A 01/27/2019   Procedure: LEFT HEART CATH AND CORONARY ANGIOGRAPHY;  Surgeon: Lyn Records, MD;  Location: [facility] INVASIVE CV LAB;  Service: Cardiovascular;  Laterality: N/A;   PELVIC FRACTURE SURGERY      Current Medications: Current Meds  Medication Sig   aspirin 81 MG chewable tablet Chew 1 tablet (81 mg total) by mouth daily.   [DISCONTINUED] apixaban (ELIQUIS) 5 MG TABS tablet Take 1 tablet (5  mg total) by mouth 2 (two) times daily. Please keep upcoming appointment.   [DISCONTINUED] atorvastatin (LIPITOR) 80 MG tablet TAKE 1 TABLET(80 MG) BY MOUTH DAILY AT 6 PM.   [DISCONTINUED] isosorbide mononitrate (IMDUR) 60 MG 24 hr tablet TAKE 1 TABLET(60 MG) BY MOUTH DAILY   [DISCONTINUED] metoprolol succinate (TOPROL-XL) 50 MG 24 hr tablet  Take 1 tablet (50 mg total) by mouth daily. Take with or immediately following a meal.   [DISCONTINUED] nitroGLYCERIN (NITROSTAT) 0.4 MG SL tablet Place 1 tablet (0.4 mg total) under the tongue every 5 (five) minutes as needed for chest pain.     Allergies:   Patient has no known allergies.   Social History   Socioeconomic History   Marital status: Single    Spouse name: Not on file   Number of children: Not on file   Years of education: Not on file   Highest education level: Not on file  Occupational History   Not on file  Tobacco Use   Smoking status: Never   Smokeless tobacco: Never  Vaping Use   Vaping status: Never Used  Substance and Sexual Activity   Alcohol use: Yes    Comment: occ   Drug use: Never   Sexual activity: Not on file  Other Topics Concern   Not on file  Social History Narrative   Not on file   Social Determinants of Health   Financial Resource Strain: Not on file  Food Insecurity: Not on file  Transportation Needs: Not on file  Physical Activity: Not on file  Stress: Not on file  Social Connections: Not on file     Family History: The patient's family history includes Breast cancer in his mother; CAD in his father.  ROS:   Please see the history of present illness.     All other systems reviewed and are negative.  EKGs/Labs/Other Studies Reviewed:    The following studies were reviewed today: NST 03-Mar-2019 Nuclear stress EF: 50%. There was no ST segment deviation noted during stress. No T wave inversion was noted during stress. Defect 1: There is a medium defect of severe severity present in the basal inferolateral, basal anterolateral and mid anterolateral location. Findings consistent with prior myocardial infarction with peri-infarct ischemia. This is a low risk study. The left ventricular ejection fraction is mildly decreased (45-54%).   Low risk stress nuclear study with a medium-sized severe lateral perfusion defect (mostly scar  with limited peri-infarct ischemia). Mildly reduced global eft ventricular systolic function.   LHC 01/2019 Total occlusion mid LAD beyond the first diagonal (chronic total).  Relatively well-formed collaterals from PDA.  The first diagonal is relatively large. Total occlusion proximal to mid circumflex (appears to be a more recent occlusion).  Very faint collaterals are noted. Left main is widely patent. Widely patent ramus intermedius. Hyperdominant RCA with irregularities noted throughout the proximal mid and distal vessel.  PDA ostium contains 60% eccentric narrowing, proximal P DA contains 50% narrowing, mid PDA contains 50% narrowing.  PDA supplies collaterals around the apex to the LAD.  Continuation of the RCA before 2 marginal branches contain 70% narrowing. Overall preserved LV function with EF 55%.  Anterior wall apex and inferoapical region revealed normal contractility.  Inferobasal region is now well identified.  EDP 19 mmHg.   RECOMMENDATIONS:   Medical therapy for underlying coronary disease.  If anginal complaints despite medical therapy, consider mechanical revascularization with CABG as a means of more total revascularization versus multisite PCI on the right  coronary (less appealing). Begin apixaban 12 hours post cath. In interim we will restart IV heparin until apixaban can be given.  Once apixaban started, heparin will be discontinued.     2D echo 01/2019 IMPRESSIONS   1. The left ventricle has normal systolic function, with an ejection  fraction of 55-60%. The cavity size was normal. Left ventricular diastolic  Doppler parameters are consistent with pseudonormalization.   2. The right ventricle has normal systolic function. The cavity was  normal. There is no increase in right ventricular wall thickness.   3. Trivial pericardial effusion is present.   4. The interatrial septum was not assessed.   EKG:   NSR, inferior infarct, HR 71bpm-personally reviewed  Recent  Labs: No results found for requested labs within last 365 days.  Recent Lipid Panel    Component Value Date/Time   CHOL 147 10/02/2021 0953   TRIG 188 (H) 10/02/2021 0953   HDL 39 (L) 10/02/2021 0953   CHOLHDL 3.8 10/02/2021 0953   CHOLHDL 3.1 01/24/2019 0721   VLDL 24 01/24/2019 0721   LDLCALC 76 10/02/2021 0953     Risk Assessment/Calculations:    CHA2DS2-VASc Score = 3  This indicates a 3.2% annual risk of stroke. The patient's score is based upon: CHF History: 0 HTN History: 1 Diabetes History: 0 Stroke History: 0 Vascular Disease History: 1 Age Score: 1 Gender Score: 0           Physical Exam:    VS:  BP 126/74   Pulse 74   Ht 6' (1.829 m)   Wt 280 lb (127 kg)   SpO2 95%   BMI 37.97 kg/m     Wt Readings from Last 3 Encounters:  03/15/23 280 lb (127 kg)  09/29/21 280 lb 6.4 oz (127.2 kg)  04/06/20 291 lb (132 kg)     GEN: Well nourished, well developed in no acute distress HEENT: Normal NECK: No JVD; No carotid bruits CARDIAC: RRR, no murmurs, rubs, gallops RESPIRATORY:  Clear to auscultation without rales, wheezing or rhonchi  ABDOMEN: Soft, non-tender, non-distended MUSCULOSKELETAL:  No edema; No deformity  SKIN: Warm and dry NEUROLOGIC:  Alert and oriented x 3 PSYCHIATRIC: Flat affect   ASSESSMENT:    1. Coronary artery disease of native artery of native heart with stable angina pectoris (HCC)   2. Essential hypertension   3. Hyperlipidemia LDL goal <70   4. Medication management   5. PAF (paroxysmal atrial fibrillation) (HCC)   6. Secondary hypercoagulable state (HCC)   7. Atrial fibrillation with RVR (HCC)    PLAN:    In order of problems listed above:  #Multivessel CAD: Last cath 01/2019 with multivessel CAD including chronic total occlusion of mid LAD with relatively well-formed collaterals from PDA, total occlusion of proximal to mid circumflex (appeared to be a more recent occlusion) with faint collaterals, hyperdominant RCA with  irregularities noted throughout the proximal, mid, and distal vessel.  Medical therapy was recommended. Deemed unlikely to be CABG candidate. Currently doing well without anginal symptoms. -Continue ASA 81mg  daily -Continue lipitor 80mg  daily -Continue metop XL 50mg  daily -Continue Imdur 60mg  daily -Nitro prn (has not required)  #Paroxysmal Afib: CHADs-vasc 3. On apixaban. -Continue apixaban 5mg  BID -Continue metop XL 50mg  daily  #HTN: Well controlled at home. -Continue metop XL 50mg  daily -Continue Imdur 60mg  daily -BMET next week  #HLD: -Resume lipitor 80mg  daily -Repeat lipids 3 months          Medication Adjustments/Labs and Tests Ordered:  Current medicines are reviewed at length with the patient today.  Concerns regarding medicines are outlined above.  Orders Placed This Encounter  Procedures   Comp Met (CMET)   Lipid Profile   CBC w/Diff   EKG 12-Lead   Meds ordered this encounter  Medications   metoprolol succinate (TOPROL-XL) 50 MG 24 hr tablet    Sig: Take 1 tablet (50 mg total) by mouth daily. Take with or immediately following a meal.    Dispense:  90 tablet    Refill:  3   nitroGLYCERIN (NITROSTAT) 0.4 MG SL tablet    Sig: Place 1 tablet (0.4 mg total) under the tongue every 5 (five) minutes as needed for chest pain.    Dispense:  25 tablet    Refill:  4   isosorbide mononitrate (IMDUR) 60 MG 24 hr tablet    Sig: TAKE 1 TABLET(60 MG) BY MOUTH DAILY    Dispense:  90 tablet    Refill:  3   atorvastatin (LIPITOR) 80 MG tablet    Sig: TAKE 1 TABLET(80 MG) BY MOUTH DAILY AT 6 PM.    Dispense:  90 tablet    Refill:  3   apixaban (ELIQUIS) 5 MG TABS tablet    Sig: Take 1 tablet (5 mg total) by mouth 2 (two) times daily.    Dispense:  180 tablet    Refill:  3    Patient Instructions  Medication Instructions:   Your physician recommends that you continue on your current medications as directed. Please refer to the Current Medication list given to you  today.  *If you need a refill on your cardiac medications before your next appointment, please call your pharmacy*   Lab Work:  IN 3 MONTHS HERE IN THE OFFICE--LIPIDS, CMET, AND CBC W DIFF--PLEASE COME FASTING TO THIS LAB APPOINTMENT  If you have labs (blood work) drawn today and your tests are completely normal, you will receive your results only by: MyChart Message (if you have MyChart) OR A paper copy in the mail If you have any lab test that is abnormal or we need to change your treatment, we will call you to review the results.    Follow-Up: At [facility], you and your health needs are our priority.  As part of our continuing mission to provide you with exceptional heart care, we have created designated Provider Care Teams.  These Care Teams include your primary Cardiologist (physician) and Advanced Practice Providers (APPs -  Physician Assistants and Nurse Practitioners) who all work together to provide you with the care you need, when you need it.  We recommend signing up for the patient portal called "MyChart".  Sign up information is provided on this After Visit Summary.  MyChart is used to connect with patients for Virtual Visits (Telemedicine).  Patients are able to view lab/test results, encounter notes, upcoming appointments, etc.  Non-urgent messages can be sent to your provider as well.   To learn more about what you can do with MyChart, go to [email or web address].    Your next appointment:   6 month(s)  Provider:   DR. Lynnette Caffey   --DR. Teiana Hajduk ADVISED THAT YOU ESTABLISH CARE WITH A PRIMARY CARE PHYSICIAN IN YOUR AREA--      Signed, Meriam Sprague, MD  03/15/2023 11:34 AM    [facility]

## 2023-03-15 ENCOUNTER — Encounter: Payer: Self-pay | Admitting: Cardiology

## 2023-03-15 ENCOUNTER — Ambulatory Visit: Payer: Medicare (Managed Care) | Attending: Cardiology | Admitting: Cardiology

## 2023-03-15 VITALS — BP 126/74 | HR 74 | Ht 72.0 in | Wt 280.0 lb

## 2023-03-15 DIAGNOSIS — E785 Hyperlipidemia, unspecified: Secondary | ICD-10-CM | POA: Diagnosis not present

## 2023-03-15 DIAGNOSIS — I480 Paroxysmal atrial fibrillation: Secondary | ICD-10-CM

## 2023-03-15 DIAGNOSIS — I4891 Unspecified atrial fibrillation: Secondary | ICD-10-CM

## 2023-03-15 DIAGNOSIS — Z79899 Other long term (current) drug therapy: Secondary | ICD-10-CM | POA: Diagnosis not present

## 2023-03-15 DIAGNOSIS — I10 Essential (primary) hypertension: Secondary | ICD-10-CM | POA: Diagnosis not present

## 2023-03-15 DIAGNOSIS — I25118 Atherosclerotic heart disease of native coronary artery with other forms of angina pectoris: Secondary | ICD-10-CM | POA: Diagnosis not present

## 2023-03-15 DIAGNOSIS — D6869 Other thrombophilia: Secondary | ICD-10-CM

## 2023-03-15 MED ORDER — ISOSORBIDE MONONITRATE ER 60 MG PO TB24: ORAL_TABLET | ORAL | 3 refills | Status: DC

## 2023-03-15 MED ORDER — NITROGLYCERIN 0.4 MG SL SUBL: 0.4000 mg | SUBLINGUAL_TABLET | SUBLINGUAL | 4 refills | Status: DC | PRN

## 2023-03-15 MED ORDER — METOPROLOL SUCCINATE ER 50 MG PO TB24: 50.0000 mg | ORAL_TABLET | Freq: Every day | ORAL | 3 refills | Status: DC

## 2023-03-15 MED ORDER — ATORVASTATIN CALCIUM 80 MG PO TABS: ORAL_TABLET | ORAL | 3 refills | Status: DC

## 2023-03-15 MED ORDER — APIXABAN 5 MG PO TABS: 5.0000 mg | ORAL_TABLET | Freq: Two times a day (BID) | ORAL | 3 refills | Status: AC

## 2023-03-15 NOTE — Patient Instructions (Addendum)
Medication Instructions:   Your physician recommends that you continue on your current medications as directed. Please refer to the Current Medication list given to you today.  *If you need a refill on your cardiac medications before your next appointment, please call your pharmacy*   Lab Work:  IN 3 MONTHS HERE IN THE OFFICE--LIPIDS, CMET, AND CBC W DIFF--PLEASE COME FASTING TO THIS LAB APPOINTMENT  If you have labs (blood work) drawn today and your tests are completely normal, you will receive your results only by: MyChart Message (if you have MyChart) OR A paper copy in the mail If you have any lab test that is abnormal or we need to change your treatment, we will call you to review the results.    Follow-Up: At [facility], you and your health needs are our priority.  As part of our continuing mission to provide you with exceptional heart care, we have created designated Provider Care Teams.  These Care Teams include your primary Cardiologist (physician) and Advanced Practice Providers (APPs -  Physician Assistants and Nurse Practitioners) who all work together to provide you with the care you need, when you need it.  We recommend signing up for the patient portal called "MyChart".  Sign up information is provided on this After Visit Summary.  MyChart is used to connect with patients for Virtual Visits (Telemedicine).  Patients are able to view lab/test results, encounter notes, upcoming appointments, etc.  Non-urgent messages can be sent to your provider as well.   To learn more about what you can do with MyChart, go to [email or web address].    Your next appointment:   6 month(s)  Provider:   DR. Lynnette Caffey   --DR. PEMBERTON ADVISED THAT YOU ESTABLISH CARE WITH A PRIMARY CARE PHYSICIAN IN YOUR AREA--

## 2023-06-10 ENCOUNTER — Ambulatory Visit: Payer: Medicare (Managed Care) | Attending: Cardiology

## 2023-06-10 DIAGNOSIS — E785 Hyperlipidemia, unspecified: Secondary | ICD-10-CM

## 2023-06-10 DIAGNOSIS — Z79899 Other long term (current) drug therapy: Secondary | ICD-10-CM

## 2023-06-10 DIAGNOSIS — I480 Paroxysmal atrial fibrillation: Secondary | ICD-10-CM

## 2023-06-10 DIAGNOSIS — I25118 Atherosclerotic heart disease of native coronary artery with other forms of angina pectoris: Secondary | ICD-10-CM

## 2023-06-10 DIAGNOSIS — I10 Essential (primary) hypertension: Secondary | ICD-10-CM

## 2023-06-10 DIAGNOSIS — I4891 Unspecified atrial fibrillation: Secondary | ICD-10-CM

## 2023-06-10 DIAGNOSIS — D6869 Other thrombophilia: Secondary | ICD-10-CM

## 2023-06-11 ENCOUNTER — Telehealth: Payer: Self-pay

## 2023-06-11 LAB — COMPREHENSIVE METABOLIC PANEL
ALT: 22 IU/L (ref 0–44)
AST: 21 IU/L (ref 0–40)
Albumin: 4.4 g/dL (ref 3.9–4.9)
Alkaline Phosphatase: 85 IU/L (ref 44–121)
BUN/Creatinine Ratio: 18 (ref 10–24)
BUN: 18 mg/dL (ref 8–27)
Bilirubin Total: 0.5 mg/dL (ref 0.0–1.2)
CO2: 24 mmol/L (ref 20–29)
Calcium: 8.7 mg/dL (ref 8.6–10.2)
Chloride: 101 mmol/L (ref 96–106)
Creatinine, Ser: 1.02 mg/dL (ref 0.76–1.27)
Globulin, Total: 2.4 g/dL (ref 1.5–4.5)
Glucose: 85 mg/dL (ref 70–99)
Potassium: 4.2 mmol/L (ref 3.5–5.2)
Sodium: 137 mmol/L (ref 134–144)
Total Protein: 6.8 g/dL (ref 6.0–8.5)
eGFR: 79 mL/min/{1.73_m2} (ref >59)

## 2023-06-11 LAB — LIPID PANEL
Cholesterol, Total: 175 mg/dL (ref 100–199)
HDL: 41 mg/dL (ref >39)
LDL CALC COMMENT:: 4.3 ratio (ref 0.0–5.0)
LDL Chol Calc (NIH): 103 mg/dL — ABNORMAL HIGH (ref 0–99)
Triglycerides: 179 mg/dL — ABNORMAL HIGH (ref 0–149)
VLDL Cholesterol Cal: 31 mg/dL (ref 5–40)

## 2023-06-11 LAB — CBC WITH DIFFERENTIAL/PLATELET
Basophils Absolute: 0.1 10*3/uL (ref 0.0–0.2)
Basos: 1 %
EOS (ABSOLUTE): 0.1 10*3/uL (ref 0.0–0.4)
Eos: 1 %
Hematocrit: 42.9 % (ref 37.5–51.0)
Hemoglobin: 14.1 g/dL (ref 13.0–17.7)
Immature Grans (Abs): 0 10*3/uL (ref 0.0–0.1)
Immature Granulocytes: 0 %
Lymphocytes Absolute: 2.4 10*3/uL (ref 0.7–3.1)
Lymphs: 36 %
MCH: 30.2 pg (ref 26.6–33.0)
MCHC: 32.9 g/dL (ref 31.5–35.7)
MCV: 92 fL (ref 79–97)
Monocytes Absolute: 0.5 10*3/uL (ref 0.1–0.9)
Monocytes: 8 %
Neutrophils Absolute: 3.6 10*3/uL (ref 1.4–7.0)
Neutrophils: 54 %
Platelets: 158 10*3/uL (ref 150–450)
RBC: 4.67 x10E6/uL (ref 4.14–5.80)
RDW: 14 % (ref 11.6–15.4)
WBC: 6.7 10*3/uL (ref 3.4–10.8)

## 2023-06-11 NOTE — Telephone Encounter (Signed)
Tried to reach patient-unable to leave voicemail

## 2023-06-11 NOTE — Telephone Encounter (Signed)
-----   Message from Orbie Pyo sent at 06/11/2023  8:15 AM EDT ----- Please let him know that his LDL is not at goal.  Continue atorvastatin 80 mg and add Zetia 10 mg; he will need lipids drawn later when he is seen in follow-up.

## 2023-06-18 ENCOUNTER — Telehealth: Payer: Self-pay

## 2023-06-18 MED ORDER — EZETIMIBE 10 MG PO TABS: 10.0000 mg | ORAL_TABLET | Freq: Every day | ORAL | 3 refills | Status: DC

## 2023-06-18 NOTE — Telephone Encounter (Signed)
-----   Message from Orbie Pyo sent at 06/11/2023  8:15 AM EDT ----- Please let him know that his LDL is not at goal.  Continue atorvastatin 80 mg and add Zetia 10 mg; he will need lipids drawn later when he is seen in follow-up.

## 2023-06-18 NOTE — Telephone Encounter (Signed)
Left message for patient to call back  

## 2023-06-18 NOTE — Telephone Encounter (Signed)
Called and spoke with patient who agrees to try Zetia. Medication sent to pharmacy on file. Understands that we will do repeat labs when he returns for next visit.

## 2023-12-02 ENCOUNTER — Encounter: Payer: Self-pay | Admitting: Internal Medicine

## 2023-12-07 NOTE — Progress Notes (Unsigned)
 Cardiology Office Note:   Date:  12/07/2023  ID:  Gary Romero, DOB 1951-03-22, MRN [id number] PCP:  Patient, No Pcp Per  [facility] Providers Cardiologist:  Alverda Skeans, MD Referring MD: No ref. provider found  Chief Complaint/Reason for Referral: Follow-up for coronary artery disease ASSESSMENT:    1. Coronary artery disease of native artery of native heart with stable angina pectoris (HCC)   2. Hyperlipidemia LDL goal <70   3. PAF (paroxysmal atrial fibrillation) (HCC)   4. Secondary hypercoagulable state (HCC)   5. CKD (chronic kidney disease) stage 2, GFR 60-89 ml/min   6. BMI 38.0-38.9,adult     PLAN:   In order of problems listed above: Coronary artery disease: Stop aspirin 81 mg, continue Eliquis 5 mg twice daily, Lipitor 80 mg, Zetia 10 mg, Imdur 60 mg, and Toprol 50 mg as well as as needed nitroglycerin.*** Hyperlipidemia: Check lipid panel, LFTs, and LP(a) today.*** Paroxysmal atrial fibrillation: Continue Eliquis 5 mg twice daily and Toprol 50 mg daily. Secondary hypercoagulable state: Continue Eliquis 5 mg twice daily. CKD stage II:*** Elevated BMI: Will check hemoglobin A1c to screen for diabetes.***        {Are you ordering a CV Procedure (e.g. stress test, cath, DCCV, TEE, etc)?   Press F2        :[id number]}   Dispo:  No follow-ups on file.      Medication Adjustments/Labs and Tests Ordered: Current medicines are reviewed at length with the patient today.  Concerns regarding medicines are outlined above.  The following changes have been made:  {PLAN; NO CHANGE:13088:s}   Labs/tests ordered: No orders of the defined types were placed in this encounter.   Medication Changes: No orders of the defined types were placed in this encounter.   Current medicines are reviewed at length with the patient today.  The patient {ACTIONS; HAS/DOES NOT HAVE:19233} concerns regarding medicines.  I spent *** minutes reviewing all clinical data during and prior  to this visit including all relevant imaging studies, laboratories, clinical information from other health systems and prior notes from both Cardiology and other specialties, interviewing the patient, conducting a complete physical examination, and coordinating care in order to formulate a comprehensive and personalized evaluation and treatment plan.   History of Present Illness:      FOCUSED PROBLEM LIST:   CAD ACS >> mid LAD CTO mid left circumflex CTO, moderate RCA disease >> med rx cath 2020 Inferolateral and anterolateral infarction >> low risk Lexiscan 2020 Hyperlipidemia PAF CV 2 score 3  On Eliquis CKD stage II BMI 38  April 2025:  Patient consents to use of AI scribe.*** The patient is here for routine follow-up.  The patient was last seen in July 2024.  At that point in time the patient was doing well without exertional angina.  He was not taking Eliquis or atorvastatin at that time.  His blood pressure was well-controlled.  Eliquis and atorvastatin were restarted.  In the interim and LDL was 103 and Zetia 10 mg was added.          Current Medications: No outpatient medications have been marked as taking for the 12/13/23 encounter (Appointment) with Gary Pyo, MD.     Review of Systems:   Please see the history of present illness.    All other systems reviewed and are negative.     EKGs/Labs/Other Test Reviewed:   EKG: EKG from July 2024 demonstrates sinus rhythm with inferior infarction pattern  EKG Interpretation  Date/Time:    Ventricular Rate:    PR Interval:    QRS Duration:    QT Interval:    QTC Calculation:   R Axis:      Text Interpretation:           Risk Assessment/Calculations:   {Does this patient have ATRIAL FIBRILLATION?:[id number]}      Physical Exam:   VS:  There were no vitals taken for this visit.   No BP recorded.  {Refresh Note OR Click here to enter BP  :1}***   Wt Readings from Last 3 Encounters:  03/15/23 280 lb (127  kg)  09/29/21 280 lb 6.4 oz (127.2 kg)  04/06/20 291 lb (132 kg)      GENERAL:  No apparent distress, AOx3 HEENT:  No carotid bruits, +2 carotid impulses, no scleral icterus CAR: RRR Irregular RR*** no murmurs***, gallops, rubs, or thrills RES:  Clear to auscultation bilaterally ABD:  Soft, nontender, nondistended, positive bowel sounds x 4 VASC:  +2 radial pulses, +2 carotid pulses NEURO:  CN 2-12 grossly intact; motor and sensory grossly intact PSYCH:  No active depression or anxiety EXT:  No edema, ecchymosis, or cyanosis  Signed, Gary Pyo, MD  12/07/2023 5:08 AM    [facility] [address] [place], [place], [place]  [address] Phone: [phone]; Fax: [phone]   Note:  This document was prepared using Dragon voice recognition software and may include unintentional dictation errors.

## 2023-12-09 ENCOUNTER — Ambulatory Visit: Payer: Self-pay | Admitting: Internal Medicine

## 2023-12-13 ENCOUNTER — Ambulatory Visit: Attending: Cardiology | Admitting: Internal Medicine

## 2023-12-13 ENCOUNTER — Encounter: Payer: Self-pay | Admitting: Internal Medicine

## 2023-12-13 VITALS — BP 134/76 | HR 88 | Ht 72.0 in | Wt 291.4 lb

## 2023-12-13 DIAGNOSIS — D6869 Other thrombophilia: Secondary | ICD-10-CM | POA: Diagnosis present

## 2023-12-13 DIAGNOSIS — N182 Chronic kidney disease, stage 2 (mild): Secondary | ICD-10-CM | POA: Diagnosis present

## 2023-12-13 DIAGNOSIS — I480 Paroxysmal atrial fibrillation: Secondary | ICD-10-CM | POA: Insufficient documentation

## 2023-12-13 DIAGNOSIS — I10 Essential (primary) hypertension: Secondary | ICD-10-CM | POA: Insufficient documentation

## 2023-12-13 DIAGNOSIS — I25118 Atherosclerotic heart disease of native coronary artery with other forms of angina pectoris: Secondary | ICD-10-CM | POA: Diagnosis present

## 2023-12-13 DIAGNOSIS — E785 Hyperlipidemia, unspecified: Secondary | ICD-10-CM | POA: Insufficient documentation

## 2023-12-13 DIAGNOSIS — Z6838 Body mass index (BMI) 38.0-38.9, adult: Secondary | ICD-10-CM | POA: Insufficient documentation

## 2023-12-13 DIAGNOSIS — I4891 Unspecified atrial fibrillation: Secondary | ICD-10-CM | POA: Diagnosis present

## 2023-12-13 DIAGNOSIS — Z79899 Other long term (current) drug therapy: Secondary | ICD-10-CM | POA: Diagnosis present

## 2023-12-13 MED ORDER — ATORVASTATIN CALCIUM 80 MG PO TABS
ORAL_TABLET | ORAL | 3 refills | Status: AC
Start: 2023-12-13 — End: ?

## 2023-12-13 MED ORDER — METOPROLOL SUCCINATE ER 50 MG PO TB24
50.0000 mg | ORAL_TABLET | Freq: Every day | ORAL | 3 refills | Status: AC
Start: 2023-12-13 — End: ?

## 2023-12-13 MED ORDER — EZETIMIBE 10 MG PO TABS: 10.0000 mg | ORAL_TABLET | Freq: Every day | ORAL | 3 refills | Status: AC

## 2023-12-13 MED ORDER — ISOSORBIDE MONONITRATE ER 60 MG PO TB24: ORAL_TABLET | ORAL | 3 refills | Status: AC

## 2023-12-13 MED ORDER — APIXABAN 5 MG PO TABS
5.0000 mg | ORAL_TABLET | Freq: Two times a day (BID) | ORAL | 0 refills | Status: AC
Start: 2023-12-13 — End: ?

## 2023-12-13 MED ORDER — NITROGLYCERIN 0.4 MG SL SUBL
0.4000 mg | SUBLINGUAL_TABLET | SUBLINGUAL | 3 refills | Status: AC | PRN
Start: 2023-12-13 — End: ?

## 2023-12-13 NOTE — Patient Instructions (Signed)
 Medication Instructions:  No changes today *If you need a refill on your cardiac medications before your next appointment, please call your pharmacy*  Lab Work: None today  Testing/Procedures: none  Follow-Up: At [facility], you and your health needs are our priority.  As part of our continuing mission to provide you with exceptional heart care, our providers are all part of one team.  This team includes your primary Cardiologist (physician) and Advanced Practice Providers or APPs (Physician Assistants and Nurse Practitioners) who all work together to provide you with the care you need, when you need it.  Your next appointment:   6 month(s)  Provider:   One of our Advanced Practice Providers (APPs): Arabella Merles, PA-C  Joni Reining, NP Edd Fabian, NP  Jacolyn Reedy, PA-C Jari Favre, PA-C Robin Searing, NP  Azalee Course, PA-C Turrell, PA-C  Bernadene Person, NP    Ronie Spies, PA-C   Rise Paganini, NP Tereso Newcomer, PA-C Marjie Skiff, PA-C  Perlie Gold, PA-C Robet Leu, PA-C   Other Instructions Complete the patient section of the patient assistance forms and return them to our office as soon as possible for processing.      1st Floor: - Lobby - Registration  - Pharmacy  - Lab - Cafe  2nd Floor: - PV Lab - Diagnostic Testing (echo, CT, nuclear med)  3rd Floor: - Vacant  4th Floor: - TCTS (cardiothoracic surgery) - AFib Clinic - Structural Heart Clinic - Vascular Surgery  - Vascular Ultrasound  5th Floor: - [facility] (general and EP) - Clinical Pharmacy for coumadin, hypertension, lipid, weight-loss medications, and med management appointments    Valet parking services will be available as well.
